# Patient Record
Sex: Male | Born: 1975 | Hispanic: Yes | Smoking: Never smoker
Health system: Southern US, Community
[De-identification: ages and names within clinical notes are randomized; demographics above are authoritative.]

## PROBLEM LIST (undated history)

## (undated) HISTORY — PX: BACK SURGERY: SHX140

---

## 2017-07-02 ENCOUNTER — Encounter (HOSPITAL_COMMUNITY): Payer: Self-pay | Admitting: *Deleted

## 2017-07-02 ENCOUNTER — Inpatient Hospital Stay (HOSPITAL_COMMUNITY)
Admission: EM | Admit: 2017-07-02 | Discharge: 2017-07-04 | DRG: 872 | Disposition: A | Discharge status: Home / Self Care | Payer: Self-pay | Attending: Internal Medicine | Admitting: Internal Medicine

## 2017-07-02 ENCOUNTER — Emergency Department (HOSPITAL_COMMUNITY): Payer: Self-pay

## 2017-07-02 ENCOUNTER — Other Ambulatory Visit: Payer: Self-pay

## 2017-07-02 DIAGNOSIS — B2790 Infectious mononucleosis, unspecified without complication: Secondary | ICD-10-CM | POA: Diagnosis present

## 2017-07-02 DIAGNOSIS — K2970 Gastritis, unspecified, without bleeding: Secondary | ICD-10-CM | POA: Diagnosis present

## 2017-07-02 DIAGNOSIS — R161 Splenomegaly, not elsewhere classified: Secondary | ICD-10-CM | POA: Diagnosis present

## 2017-07-02 DIAGNOSIS — R197 Diarrhea, unspecified: Secondary | ICD-10-CM | POA: Diagnosis present

## 2017-07-02 DIAGNOSIS — R112 Nausea with vomiting, unspecified: Secondary | ICD-10-CM | POA: Diagnosis present

## 2017-07-02 DIAGNOSIS — K529 Noninfective gastroenteritis and colitis, unspecified: Secondary | ICD-10-CM

## 2017-07-02 DIAGNOSIS — D72829 Elevated white blood cell count, unspecified: Secondary | ICD-10-CM

## 2017-07-02 DIAGNOSIS — A419 Sepsis, unspecified organism: Principal | ICD-10-CM | POA: Diagnosis present

## 2017-07-02 DIAGNOSIS — R109 Unspecified abdominal pain: Secondary | ICD-10-CM | POA: Diagnosis present

## 2017-07-02 LAB — CBC WITH DIFFERENTIAL/PLATELET
BASOS PCT: 0 %
Band Neutrophils: 10 %
Basophils Absolute: 0 10*3/uL (ref 0.0–0.1)
Blasts: 0 %
EOS PCT: 22 %
Eosinophils Absolute: 4.7 10*3/uL — ABNORMAL HIGH (ref 0.0–0.7)
HCT: 38.7 % — ABNORMAL LOW (ref 39.0–52.0)
Hemoglobin: 12.8 g/dL — ABNORMAL LOW (ref 13.0–17.0)
LYMPHS ABS: 1.7 10*3/uL (ref 0.7–4.0)
Lymphocytes Relative: 8 %
MCH: 29.1 pg (ref 26.0–34.0)
MCHC: 33.1 g/dL (ref 30.0–36.0)
MCV: 88 fL (ref 78.0–100.0)
MONO ABS: 1.3 10*3/uL — AB (ref 0.1–1.0)
MONOS PCT: 6 %
MYELOCYTES: 0 %
Metamyelocytes Relative: 1 %
NEUTROS ABS: 13.7 10*3/uL — AB (ref 1.7–7.7)
NRBC: 0 /100{WBCs}
Neutrophils Relative %: 53 %
Other: 0 %
PLATELETS: 277 10*3/uL (ref 150–400)
Promyelocytes Relative: 0 %
RBC: 4.4 MIL/uL (ref 4.22–5.81)
RDW: 13.6 % (ref 11.5–15.5)
WBC: 21.4 10*3/uL — ABNORMAL HIGH (ref 4.0–10.5)

## 2017-07-02 LAB — URINALYSIS, ROUTINE W REFLEX MICROSCOPIC
Bacteria, UA: NONE SEEN
Bilirubin Urine: NEGATIVE
GLUCOSE, UA: NEGATIVE mg/dL
KETONES UR: 20 mg/dL — AB
Leukocytes, UA: NEGATIVE
NITRITE: NEGATIVE
PH: 6 (ref 5.0–8.0)
Protein, ur: NEGATIVE mg/dL
Specific Gravity, Urine: 1.01 (ref 1.005–1.030)
Squamous Epithelial / LPF: NONE SEEN

## 2017-07-02 LAB — COMPREHENSIVE METABOLIC PANEL
ALT: 22 U/L (ref 17–63)
ANION GAP: 11 (ref 5–15)
AST: 22 U/L (ref 15–41)
Albumin: 3.1 g/dL — ABNORMAL LOW (ref 3.5–5.0)
Alkaline Phosphatase: 66 U/L (ref 38–126)
BUN: 13 mg/dL (ref 6–20)
CO2: 22 mmol/L (ref 22–32)
Calcium: 8.2 mg/dL — ABNORMAL LOW (ref 8.9–10.3)
Chloride: 99 mmol/L — ABNORMAL LOW (ref 101–111)
Creatinine, Ser: 0.89 mg/dL (ref 0.61–1.24)
Glucose, Bld: 93 mg/dL (ref 65–99)
POTASSIUM: 4 mmol/L (ref 3.5–5.1)
Sodium: 132 mmol/L — ABNORMAL LOW (ref 135–145)
Total Bilirubin: 0.8 mg/dL (ref 0.3–1.2)
Total Protein: 6.8 g/dL (ref 6.5–8.1)

## 2017-07-02 LAB — I-STAT CG4 LACTIC ACID, ED: Lactic Acid, Venous: 0.72 mmol/L (ref 0.5–1.9)

## 2017-07-02 LAB — LIPASE, BLOOD: LIPASE: 31 U/L (ref 11–51)

## 2017-07-02 MED ORDER — ONDANSETRON 4 MG PO TBDP: 8.0000 mg | ORAL_TABLET | Freq: Once | ORAL | Status: DC

## 2017-07-02 MED ORDER — ONDANSETRON HCL 4 MG/2ML IJ SOLN
4.0000 mg | Freq: Once | INTRAMUSCULAR | Status: AC
  Administered 2017-07-02: 4 mg via INTRAVENOUS
  Filled 2017-07-02: qty 2

## 2017-07-02 MED ORDER — IOPAMIDOL (ISOVUE-300) INJECTION 61%
INTRAVENOUS | Status: AC
  Filled 2017-07-02: qty 100

## 2017-07-02 MED ORDER — IOPAMIDOL (ISOVUE-300) INJECTION 61%
100.0000 mL | Freq: Once | INTRAVENOUS | Status: AC | PRN
  Administered 2017-07-02: 100 mL via INTRAVENOUS

## 2017-07-02 NOTE — ED Notes (Signed)
Pt taken to CT.

## 2017-07-02 NOTE — ED Notes (Addendum)
Pts name called for a room no answer 

## 2017-07-02 NOTE — ED Triage Notes (Signed)
Per Interpreter: pt has been having LUQ pain for the past week that worsens after eating with fever. Pt noted to be diaphoretic. Also reports sore throat and burning in hands and feet.

## 2017-07-02 NOTE — ED Provider Notes (Signed)
Patient placed in Quick Look pathway, seen and evaluated   Chief Complaint: abdominal pain  HPI:   Upper abdominal pain in upper abdomen for a week. Initially nausea and vomiting, improved now. Associated diarrhea.  No blood in his stool or emesis.  He has had fevers at home for the last several days.  No urinary symptoms.  ROS: abd pain, nauea, vomiting, diarrhea, fever  Physical Exam:   Gen: Appears to be in pain  Neuro: Awake and Alert  Skin: Warm    Focused Exam: Patient appears to be ill.  He is mildly diaphoretic.  Tenderness to palpation of left upper quadrant and left lower quadrant with some guarding.  Patient with severe left-sided abdominal pain, pain mainly in left upper quadrant and left lower quadrant.  Concerning for colitis in setting of a fever.  Will check labs including lipase to rule out pancreatitis.  Zofran ordered for nausea and vomiting.  Will get CT scan for further evaluation of patient's symptoms.  Does not meet sepsis criteria at this time.  Vitals:   07/02/17 2019 07/02/17 2020  BP:  106/76  Pulse:  98  Resp:  (!) 22  Temp:  100.3 F (37.9 C)  TempSrc:  Oral  SpO2:  100%  Weight: 81.6 kg (180 lb)   Height: 5\' 6"  (1.676 m)       Initiation of care has begun. The patient has been counseled on the process, plan, and necessity for staying for the completion/evaluation, and the remainder of the medical screening examination    Jaynie CrumbleKirichenko, Baeleigh Devincent, Cordelia Poche-C 07/02/17 2044    Melene PlanFloyd, Dan, DO 07/02/17 2307

## 2017-07-03 ENCOUNTER — Observation Stay (HOSPITAL_COMMUNITY): Payer: Self-pay

## 2017-07-03 ENCOUNTER — Other Ambulatory Visit: Payer: Self-pay

## 2017-07-03 ENCOUNTER — Encounter (HOSPITAL_COMMUNITY): Payer: Self-pay | Admitting: Internal Medicine

## 2017-07-03 DIAGNOSIS — R161 Splenomegaly, not elsewhere classified: Secondary | ICD-10-CM | POA: Diagnosis present

## 2017-07-03 DIAGNOSIS — B2790 Infectious mononucleosis, unspecified without complication: Secondary | ICD-10-CM | POA: Diagnosis present

## 2017-07-03 DIAGNOSIS — R112 Nausea with vomiting, unspecified: Secondary | ICD-10-CM

## 2017-07-03 DIAGNOSIS — R109 Unspecified abdominal pain: Secondary | ICD-10-CM | POA: Diagnosis present

## 2017-07-03 DIAGNOSIS — A419 Sepsis, unspecified organism: Secondary | ICD-10-CM | POA: Diagnosis present

## 2017-07-03 DIAGNOSIS — R197 Diarrhea, unspecified: Secondary | ICD-10-CM

## 2017-07-03 DIAGNOSIS — R1012 Left upper quadrant pain: Secondary | ICD-10-CM

## 2017-07-03 LAB — CBC
HEMATOCRIT: 26.9 % — AB (ref 39.0–52.0)
HEMOGLOBIN: 9 g/dL — AB (ref 13.0–17.0)
MCH: 29.9 pg (ref 26.0–34.0)
MCHC: 33.5 g/dL (ref 30.0–36.0)
MCV: 89.4 fL (ref 78.0–100.0)
Platelets: 196 10*3/uL (ref 150–400)
RBC: 3.01 MIL/uL — AB (ref 4.22–5.81)
RDW: 13.8 % (ref 11.5–15.5)
WBC: 15.3 10*3/uL — ABNORMAL HIGH (ref 4.0–10.5)

## 2017-07-03 LAB — I-STAT CG4 LACTIC ACID, ED: LACTIC ACID, VENOUS: 1.07 mmol/L (ref 0.5–1.9)

## 2017-07-03 LAB — PROTIME-INR
INR: 1.23
Prothrombin Time: 15.4 seconds — ABNORMAL HIGH (ref 11.4–15.2)

## 2017-07-03 LAB — BASIC METABOLIC PANEL
ANION GAP: 7 (ref 5–15)
BUN: 10 mg/dL (ref 6–20)
CHLORIDE: 106 mmol/L (ref 101–111)
CO2: 23 mmol/L (ref 22–32)
CREATININE: 0.82 mg/dL (ref 0.61–1.24)
Calcium: 7.1 mg/dL — ABNORMAL LOW (ref 8.9–10.3)
GFR calc non Af Amer: >60 mL/min (ref >60)
Glucose, Bld: 87 mg/dL (ref 65–99)
POTASSIUM: 3.9 mmol/L (ref 3.5–5.1)
Sodium: 136 mmol/L (ref 135–145)

## 2017-07-03 LAB — GROUP A STREP BY PCR: GROUP A STREP BY PCR: NOT DETECTED

## 2017-07-03 LAB — PROCALCITONIN: Procalcitonin: 0.36 ng/mL

## 2017-07-03 LAB — LACTIC ACID, PLASMA: Lactic Acid, Venous: 0.5 mmol/L (ref 0.5–1.9)

## 2017-07-03 LAB — MONONUCLEOSIS SCREEN: MONO SCREEN: POSITIVE — AB

## 2017-07-03 LAB — HIV ANTIBODY (ROUTINE TESTING W REFLEX): HIV SCREEN 4TH GENERATION: NONREACTIVE

## 2017-07-03 MED ORDER — SODIUM CHLORIDE 0.9 % IV SOLN
INTRAVENOUS | Status: DC
Start: 2017-07-03 — End: 2017-07-04
  Administered 2017-07-03 (×2): via INTRAVENOUS

## 2017-07-03 MED ORDER — ACETAMINOPHEN 325 MG PO TABS
650.0000 mg | ORAL_TABLET | Freq: Four times a day (QID) | ORAL | Status: DC | PRN
  Administered 2017-07-03: 650 mg via ORAL
  Filled 2017-07-03: qty 2

## 2017-07-03 MED ORDER — PIPERACILLIN-TAZOBACTAM 3.375 G IVPB
INTRAVENOUS | Status: AC
  Administered 2017-07-03: 04:00:00
  Filled 2017-07-03: qty 50

## 2017-07-03 MED ORDER — FAMOTIDINE IN NACL 20-0.9 MG/50ML-% IV SOLN: 20.0000 mg | Freq: Two times a day (BID) | INTRAVENOUS | Status: DC

## 2017-07-03 MED ORDER — ONDANSETRON HCL 4 MG/2ML IJ SOLN: 4.0000 mg | Freq: Three times a day (TID) | INTRAMUSCULAR | Status: DC | PRN

## 2017-07-03 MED ORDER — PANTOPRAZOLE SODIUM 40 MG IV SOLR
40.0000 mg | Freq: Two times a day (BID) | INTRAVENOUS | Status: DC
  Administered 2017-07-03 (×2): 40 mg via INTRAVENOUS
  Filled 2017-07-03 (×2): qty 40

## 2017-07-03 MED ORDER — TRAMADOL HCL 50 MG PO TABS
50.0000 mg | ORAL_TABLET | Freq: Three times a day (TID) | ORAL | Status: DC | PRN
  Administered 2017-07-03 – 2017-07-04 (×3): 50 mg via ORAL
  Filled 2017-07-03 (×3): qty 1

## 2017-07-03 MED ORDER — MENTHOL 3 MG MT LOZG: 1.0000 | LOZENGE | OROMUCOSAL | Status: DC | PRN

## 2017-07-03 MED ORDER — SODIUM CHLORIDE 0.9 % IV BOLUS
1000.0000 mL | Freq: Once | INTRAVENOUS | Status: AC
  Administered 2017-07-03: 1000 mL via INTRAVENOUS

## 2017-07-03 MED ORDER — ENOXAPARIN SODIUM 40 MG/0.4ML ~~LOC~~ SOLN
40.0000 mg | SUBCUTANEOUS | Status: DC
  Administered 2017-07-03: 40 mg via SUBCUTANEOUS
  Filled 2017-07-03 (×2): qty 0.4

## 2017-07-03 MED ORDER — ZOLPIDEM TARTRATE 5 MG PO TABS: 5.0000 mg | ORAL_TABLET | Freq: Every evening | ORAL | Status: DC | PRN

## 2017-07-03 MED ORDER — ONDANSETRON HCL 4 MG/2ML IJ SOLN
INTRAMUSCULAR | Status: AC
  Administered 2017-07-03: 04:00:00
  Filled 2017-07-03: qty 2

## 2017-07-03 MED ORDER — MORPHINE SULFATE (PF) 4 MG/ML IV SOLN
INTRAVENOUS | Status: AC
  Administered 2017-07-03: 04:00:00
  Filled 2017-07-03: qty 1

## 2017-07-03 MED ORDER — SODIUM CHLORIDE 0.9 % IV BOLUS
1500.0000 mL | Freq: Once | INTRAVENOUS | Status: AC
  Administered 2017-07-03: 1500 mL via INTRAVENOUS

## 2017-07-03 NOTE — ED Notes (Signed)
Ordered pt's Full Liquid Diet tray.

## 2017-07-03 NOTE — ED Notes (Signed)
Admit dr Louretta Parmaagaldo called and [facility]

## 2017-07-03 NOTE — ED Notes (Signed)
ED Provider at bedside. 

## 2017-07-03 NOTE — ED Provider Notes (Signed)
[facility] EMERGENCY DEPARTMENT Provider Note   CSN: [id number] Arrival date & time: 07/02/17  1944     History   Chief Complaint Chief Complaint  Patient presents with  . Abdominal Pain    HPI Jared Copeland is a 42 y.o. male.  Level 5 caveat for language barrier.  Patient presents with left-sided upper abdominal pain that has been coming and going for the past week.  States it waxes and wanes in intensity and is worse when he tries to eat.  He had nausea and vomiting several days ago but none since.  Still has several episodes of loose stools daily.  Denies any fever but has had subjective chills.  No sick contacts.  No recent travel.  He was born in [place] but has been in the [place] for the past 2 years.  Denies any pain with urination or blood in the urine.  No chest pain or shortness of breath.  No previous abdominal surgeries.  The history is provided by the patient. The history is limited by the condition of the patient and a language barrier.  Abdominal Pain   Associated symptoms include nausea and vomiting. Pertinent negatives include fever, dysuria, hematuria, arthralgias and myalgias.    History reviewed. No pertinent past medical history.  There are no active problems to display for this patient.   Past Surgical History:  Procedure Laterality Date  . BACK SURGERY          Home Medications    Prior to Admission medications   Not on File    Family History No family history on file.  Social History Social History   Tobacco Use  . Smoking status: Never Smoker  . Smokeless tobacco: Never Used  Substance Use Topics  . Alcohol use: Never    Frequency: Never  . Drug use: Never     Allergies   Patient has no known allergies.   Review of Systems Review of Systems  Constitutional: Positive for activity change, appetite change, chills and fatigue. Negative for fever.  HENT: Negative for congestion.   Eyes: Negative for  visual disturbance.  Respiratory: Negative for cough, chest tightness and shortness of breath.   Cardiovascular: Negative for chest pain.  Gastrointestinal: Positive for abdominal pain, nausea and vomiting.  Genitourinary: Negative for dysuria, hematuria, testicular pain and urgency.  Musculoskeletal: Negative for arthralgias and myalgias.  Skin: Negative for rash.  Neurological: Negative for dizziness, weakness and light-headedness.    all other systems are negative except as noted in the HPI and PMH.    Physical Exam Updated Vital Signs BP 102/61 (BP Location: Right Arm)   Pulse 82   Temp 99.5 F (37.5 C) (Oral)   Resp 16   Ht 5\' 6"  (1.676 m)   Wt 81.6 kg (180 lb)   SpO2 99%   BMI 29.05 kg/m   Physical Exam  Constitutional: He is oriented to person, place, and time. He appears well-developed and well-nourished. No distress.  Appears ill  HENT:  Head: Normocephalic and atraumatic.  Mouth/Throat: Oropharynx is clear and moist. No oropharyngeal exudate.  Eyes: Pupils are equal, round, and reactive to light. Conjunctivae and EOM are normal.  Neck: Normal range of motion. Neck supple.  No meningismus.  Cardiovascular: Normal rate, regular rhythm, normal heart sounds and intact distal pulses.  No murmur heard. Pulmonary/Chest: Effort normal and breath sounds normal. No respiratory distress.  Abdominal: Soft. There is tenderness. There is guarding. There is no rebound.  TTP LUQ with guarding  Genitourinary:  Genitourinary Comments: No testicular pain  Musculoskeletal: Normal range of motion. He exhibits no edema or tenderness.  Neurological: He is alert and oriented to person, place, and time. No cranial nerve deficit. He exhibits normal muscle tone. Coordination normal.  No ataxia on finger to nose bilaterally. No pronator drift. 5/5 strength throughout. CN 2-12 intact.Equal grip strength. Sensation intact.   Skin: Skin is warm.  Psychiatric: He has a normal mood and affect.  His behavior is normal.  Nursing note and vitals reviewed.    ED Treatments / Results  Labs (all labs ordered are listed, but only abnormal results are displayed) Labs Reviewed  CBC WITH DIFFERENTIAL/PLATELET - Abnormal; Notable for the following components:      Result Value   WBC 21.4 (*)    Hemoglobin 12.8 (*)    HCT 38.7 (*)    Neutro Abs 13.7 (*)    Monocytes Absolute 1.3 (*)    Eosinophils Absolute 4.7 (*)    All other components within normal limits  COMPREHENSIVE METABOLIC PANEL - Abnormal; Notable for the following components:   Sodium 132 (*)    Chloride 99 (*)    Calcium 8.2 (*)    Albumin 3.1 (*)    All other components within normal limits  URINALYSIS, ROUTINE W REFLEX MICROSCOPIC - Abnormal; Notable for the following components:   Hgb urine dipstick SMALL (*)    Ketones, ur 20 (*)    All other components within normal limits  MONONUCLEOSIS SCREEN - Abnormal; Notable for the following components:   Mono Screen POSITIVE (*)    All other components within normal limits  GROUP A STREP BY PCR  GASTROINTESTINAL PANEL BY PCR, STOOL (REPLACES STOOL CULTURE)  C DIFFICILE QUICK SCREEN W PCR REFLEX  RAPID STREP SCREEN ([facility] & [facility] ONLY)  LIPASE, BLOOD  EPSTEIN-BARR VIRUS NUCLEAR ANTIGEN ANTIBODY, IGG  I-STAT CG4 LACTIC ACID, ED  I-STAT CG4 LACTIC ACID, ED  I-STAT CG4 LACTIC ACID, ED    EKG None  Radiology Ct Abdomen Pelvis W Contrast  Result Date: 07/02/2017 CLINICAL DATA:  Left upper quadrant abdominal pain for 1 week, worsened postprandially. EXAM: CT ABDOMEN AND PELVIS WITH CONTRAST TECHNIQUE: Multidetector CT imaging of the abdomen and pelvis was performed using the standard protocol following bolus administration of intravenous contrast. CONTRAST:  ISOVUE-300 IOPAMIDOL (ISOVUE-300) INJECTION 61% COMPARISON:  None. FINDINGS: Lower chest: No significant pulmonary nodules or acute consolidative airspace disease. Hepatobiliary: Normal liver size. No liver  mass. Normal gallbladder with no radiopaque cholelithiasis. No biliary ductal dilatation. Pancreas: Normal, with no mass or duct dilation. Spleen: Mildly enlarged spleen (craniocaudal splenic length 13.7 cm). No splenic mass. Adrenals/Urinary Tract: Normal adrenals. Normal kidneys with no hydronephrosis and no renal mass. Normal bladder. Stomach/Bowel: Normal non-distended stomach. Normal caliber small bowel with no small bowel wall thickening. Normal appendix. Normal large bowel with no diverticulosis, large bowel wall thickening or pericolonic fat stranding. Vascular/Lymphatic: Normal caliber abdominal aorta. Patent portal, splenic, hepatic and renal veins. A few mildly enlarged left mesenteric nodes up to 1.0 cm (series 3/image 31). No additional pathologically enlarged abdominopelvic nodes. Reproductive: Mildly enlarged prostate. Other: No pneumoperitoneum, ascites or focal fluid collection. Musculoskeletal: No aggressive appearing focal osseous lesions. Postsurgical changes from anterior and bilateral posterior spinal fusion at L5-S1. Mild thoracolumbar spondylosis. IMPRESSION: 1. Mildly enlarged spleen. Mild left mesenteric lymphadenopathy. These findings are nonspecific, with a lymphoproliferative condition not excluded. Clinical and laboratory correlation advised. Follow-up CT abdomen/pelvis with  oral and IV contrast suggested in 3 months. This recommendation follows ACR consensus guidelines: White Paper of the ACR Incidental Findings Committee II on Splenic and Nodal Findings. J Am Coll Radiol 2013;10:789-794. 2. Mildly enlarged prostate. Electronically Signed   By: Delbert PhenixJason A Poff M.D.   On: 07/02/2017 22:00    Procedures Procedures (including critical care time)  Medications Ordered in ED Medications  iopamidol (ISOVUE-300) 61 % injection (has no administration in time range)  morphine 4 MG/ML injection (has no administration in time range)  ondansetron (ZOFRAN) 4 MG/2ML injection (has no  administration in time range)  piperacillin-tazobactam (ZOSYN) 3.375 GM/50ML IVPB (has no administration in time range)  ondansetron (ZOFRAN) injection 4 mg (4 mg Intravenous Given 07/02/17 2055)  iopamidol (ISOVUE-300) 61 % injection 100 mL (100 mLs Intravenous Contrast Given 07/02/17 2140)     Initial Impression / Assessment and Plan / ED Course  I have reviewed the triage vital signs and the nursing notes.  Pertinent labs & imaging results that were available during my care of the patient were reviewed by me and considered in my medical decision making (see chart for details).    Patient with 1 week of upper abdominal pain with subjective chills, intermittent diarrhea and nausea and vomiting.  Vitals are stable.  Labs obtained in triage show leukocytosis with no fever.  Imaging obtained in triage shows enlarged spleen with inflammation of the left-sided mesentery.  Patient is febrile with leukocytosis.  Monospot and EBV will be checked.  He is given IV fluids As well as a dose of IV Zofran given his inflammation on his CT scan and fever.  Unclear source of patient's spleen enlargement with left-sided abdominal pain fever. Patient does not have a doctor.  He appears ill but nontoxic. Observation admission discussed with Dr. Clyde LundborgNiu. Final Clinical Impressions(s) / ED Diagnoses   Final diagnoses:  Colitis  Leukocytosis, unspecified type    ED Discharge Orders    None       Joene Gelder, Jeannett SeniorStephen, MD 07/03/17 [phone]

## 2017-07-03 NOTE — ED Notes (Signed)
PAGED ADMITTING PER RN  

## 2017-07-03 NOTE — ED Notes (Signed)
Pt's lunch tray arrived. 

## 2017-07-03 NOTE — ED Notes (Signed)
[facility]

## 2017-07-03 NOTE — ED Notes (Signed)
PT asleep at this time. Full liquid meal ordered for PT.

## 2017-07-03 NOTE — ED Notes (Signed)
Pt given 4MG  IV morphine, 4MG  IV zofran, and 3.3G zosyn during downtime.

## 2017-07-03 NOTE — Plan of Care (Signed)
Plan of care 

## 2017-07-03 NOTE — H&P (Addendum)
History and Physical    Jared HurstJose David Ramirez Copeland WUJ:[id number]:[id number] DOB: 03/22/1976 DOA: 07/02/2017  Referring MD/NP/PA:   PCP: No primary care provider on file.   Patient coming from:  The patient is coming from home.  At baseline, pt is independent for most of ADL.   Chief Complaint: abdominal pain, nausea, vomiting, diarrhea  HPI: Jared HurstJose David Ramirez Copeland is a 42 y.o. male without significant past medical history, who presents with abdominal pain, nausea vomiting diarrhea.  Patient states that his abdominal pain has been going on for more than one week. It is located in the left upper quadrant, constant, 7 out of 10 in severity, nonradiating, dull. It is associated with nausea, vomiting and diarrhea. He also has fever and chills. He vomited many times yesterday, and had 4 watery diarrhea yesterday. He reports sore throat, but no cough, shortness of breath or chest pain. He has runny nose. No symptoms of UTI or unilateral weakness.  ED Course: pt was found to have positive mononucleosis screening test, negative rapid strep PCR, WBC 21.4, lipase 0.72, lipase 31, negative urinalysis, electrolytes renal function okay, temperature 100.3, no tachycardia, has tachypnea, oxygen saturation 99% on room air. Patient is placed on MedSurg bed for observation.  # CT abdomen/pelvis showed mild mildly enlarged spleen, mild left mesenteric lymphadenopathy, and mildly enlarged prostate.  Review of Systems:   General: has fevers, chills, no body weight gain, has poor appetite, has fatigue HEENT: no blurry vision, hearing changes or sore throat Respiratory: no dyspnea, coughing, wheezing CV: no chest pain, no palpitations GI: has nausea, vomiting, abdominal pain, diarrhea, no constipation GU: no dysuria, burning on urination, increased urinary frequency, hematuria  Ext: no leg edema Neuro: no unilateral weakness, numbness, or tingling, no vision change or hearing loss Skin: no rash, no skin tear. MSK: No  muscle spasm, no deformity, no limitation of range of movement in spin Heme: No easy bruising.  Travel history: No recent long distant travel.  Allergy: No Known Allergies  History reviewed. No pertinent past medical history.  Past Surgical History:  Procedure Laterality Date  . BACK SURGERY      Social History:  reports that he has never smoked. He has never used smokeless tobacco. He reports that he does not drink alcohol or use drugs.  Family History: Reviewed with patient. Patient does not know medical history of his parents, and he states that his brothers and sisters are all healthy.  Prior to Admission medications   Not on File    Physical Exam: Vitals:   07/02/17 2242 07/03/17 0430 07/03/17 0500 07/03/17 0530  BP: 102/61 100/61 99/62 107/67  Pulse: 82 73 72 79  Resp: 16 19 18 12   Temp: 99.5 F (37.5 C)     TempSrc: Oral     SpO2: 99% 99% 96% 100%  Weight:      Height:       General: Not in acute distress HEENT:       Eyes: PERRL, EOMI, no scleral icterus.       ENT: No discharge from the ears and nose, has pharynx injection, no tonsillar enlargement.        Neck: No JVD, no bruit, no mass felt. Heme: No neck lymph node enlargement. Cardiac: S1/S2, RRR, No murmurs, No gallops or rubs. Respiratory: No rales, wheezing, rhonchi or rubs. GI: Soft, nondistended, has tenderness in LUQ, no rebound pain, no organomegaly, BS present. GU: No hematuria Ext: No pitting leg edema bilaterally. 2+DP/PT pulse bilaterally.  Musculoskeletal: No joint deformities, No joint redness or warmth, no limitation of ROM in spin. Skin: No rashes.  Neuro: Alert, oriented X3, cranial nerves II-XII grossly intact, moves all extremities normally.  Psych: Patient is not psychotic, no suicidal or hemocidal ideation.  Labs on Admission: I have personally reviewed following labs and imaging studies  CBC: Recent Labs  Lab 07/02/17 2043  WBC 21.4*  NEUTROABS 13.7*  HGB 12.8*  HCT 38.7*    MCV 88.0  PLT 277   Basic Metabolic Panel: Recent Labs  Lab 07/02/17 2043  NA 132*  K 4.0  CL 99*  CO2 22  GLUCOSE 93  BUN 13  CREATININE 0.89  CALCIUM 8.2*   GFR: Estimated Creatinine Clearance: 109.5 mL/min (by C-G formula based on SCr of 0.89 mg/dL). Liver Function Tests: Recent Labs  Lab 07/02/17 2043  AST 22  ALT 22  ALKPHOS 66  BILITOT 0.8  PROT 6.8  ALBUMIN 3.1*   Recent Labs  Lab 07/02/17 2043  LIPASE 31   No results for input(s): AMMONIA in the last 168 hours. Coagulation Profile: No results for input(s): INR, PROTIME in the last 168 hours. Cardiac Enzymes: No results for input(s): CKTOTAL, CKMB, CKMBINDEX, TROPONINI in the last 168 hours. BNP (last 3 results) No results for input(s): PROBNP in the last 8760 hours. HbA1C: No results for input(s): HGBA1C in the last 72 hours. CBG: No results for input(s): GLUCAP in the last 168 hours. Lipid Profile: No results for input(s): CHOL, HDL, LDLCALC, TRIG, CHOLHDL, LDLDIRECT in the last 72 hours. Thyroid Function Tests: No results for input(s): TSH, T4TOTAL, FREET4, T3FREE, THYROIDAB in the last 72 hours. Anemia Panel: No results for input(s): VITAMINB12, FOLATE, FERRITIN, TIBC, IRON, RETICCTPCT in the last 72 hours. Urine analysis:    Component Value Date/Time   COLORURINE YELLOW 07/02/2017 2129   APPEARANCEUR CLEAR 07/02/2017 2129   LABSPEC 1.010 07/02/2017 2129   PHURINE 6.0 07/02/2017 2129   GLUCOSEU NEGATIVE 07/02/2017 2129   HGBUR SMALL (A) 07/02/2017 2129   BILIRUBINUR NEGATIVE 07/02/2017 2129   KETONESUR 20 (A) 07/02/2017 2129   PROTEINUR NEGATIVE 07/02/2017 2129   NITRITE NEGATIVE 07/02/2017 2129   LEUKOCYTESUR NEGATIVE 07/02/2017 2129   Sepsis Labs: @LABRCNTIP (procalcitonin:4,lacticidven:4) ) Recent Results (from the past 240 hour(s))  Group A Strep by PCR     Status: None   Collection Time: 07/03/17  4:20 AM  Result Value Ref Range Status   Group A Strep by PCR NOT DETECTED NOT  DETECTED Final    Comment: Performed at [facility], [address] [place], [place] [address]     Radiological Exams on Admission: Ct Abdomen Pelvis W Contrast  Result Date: 07/02/2017 CLINICAL DATA:  Left upper quadrant abdominal pain for 1 week, worsened postprandially. EXAM: CT ABDOMEN AND PELVIS WITH CONTRAST TECHNIQUE: Multidetector CT imaging of the abdomen and pelvis was performed using the standard protocol following bolus administration of intravenous contrast. CONTRAST:  ISOVUE-300 IOPAMIDOL (ISOVUE-300) INJECTION 61% COMPARISON:  None. FINDINGS: Lower chest: No significant pulmonary nodules or acute consolidative airspace disease. Hepatobiliary: Normal liver size. No liver mass. Normal gallbladder with no radiopaque cholelithiasis. No biliary ductal dilatation. Pancreas: Normal, with no mass or duct dilation. Spleen: Mildly enlarged spleen (craniocaudal splenic length 13.7 cm). No splenic mass. Adrenals/Urinary Tract: Normal adrenals. Normal kidneys with no hydronephrosis and no renal mass. Normal bladder. Stomach/Bowel: Normal non-distended stomach. Normal caliber small bowel with no small bowel wall thickening. Normal appendix. Normal large bowel with no  diverticulosis, large bowel wall thickening or pericolonic fat stranding. Vascular/Lymphatic: Normal caliber abdominal aorta. Patent portal, splenic, hepatic and renal veins. A few mildly enlarged left mesenteric nodes up to 1.0 cm (series 3/image 31). No additional pathologically enlarged abdominopelvic nodes. Reproductive: Mildly enlarged prostate. Other: No pneumoperitoneum, ascites or focal fluid collection. Musculoskeletal: No aggressive appearing focal osseous lesions. Postsurgical changes from anterior and bilateral posterior spinal fusion at L5-S1. Mild thoracolumbar spondylosis. IMPRESSION: 1. Mildly enlarged spleen. Mild left mesenteric lymphadenopathy. These findings are nonspecific, with a lymphoproliferative condition  not excluded. Clinical and laboratory correlation advised. Follow-up CT abdomen/pelvis with oral and IV contrast suggested in 3 months. This recommendation follows ACR consensus guidelines: White Paper of the ACR Incidental Findings Committee II on Splenic and Nodal Findings. J Am Coll Radiol 2013;10:789-794. 2. Mildly enlarged prostate. Electronically Signed   By: Delbert Phenix M.D.   On: 07/02/2017 22:00     EKG: Not done in ED   Assessment/Plan Principal Problem:   Mononucleosis Active Problems:   Sepsis (HCC)   Nausea vomiting and diarrhea   Diarrhea   Abdominal pain   Splenomegaly   Mononucleosis and sepsis: pt meets criteria for sepsis with leukocytosis, fever and tachypnea. Lactic acid is normal. Currently hemodynamically stable. Pt was given one dose of zosyn before knowing positive mono test in ED, will not continue.  -will place on med-surg bed for obs -surportive care -will get Procalcitonin and trend lactic acid levels per sepsis protocol. -IVF: 2.5 L of NS bolus in ED, followed by 100 cc/h  -f/u Bx -contact and droplet precaution -Cepacol prn for sore throat  Nausea vomiting and diarrhea, and abdominal pain: likely 2/2 to mononucleosis. -Supportive care as above -Follow-up GI pathogen panel and c diff pcr were ordered by ED physician -IV pepcid   Splenomegaly: 2/2 to Mononucleosis -Patient needs to be educated to avoid risk of spleen rupture    DVT ppx: SQ Lovenox Code Status: Full code Family Communication: None at bed side.   Disposition Plan:  Anticipate discharge back to previous home environment Consults called:  none Admission status:    medical floor/obs        Date of Service 07/03/2017    Lorretta Harp [facility] Pager [phone]  If 7PM-7AM, please contact night-coverage [email or web address] Password [facility] 07/03/2017, 6:03 AM

## 2017-07-03 NOTE — ED Notes (Signed)
Please see downtime record.

## 2017-07-03 NOTE — Progress Notes (Addendum)
PROGRESS NOTE    Jared Copeland  FAO:[id number] DOB: 08/17/1975 DOA: 07/02/2017 PCP: No primary care provider on file.  Brief Narrative:  Jared Copeland is a 42 y.o. male without significant past medical history, who presents with abdominal pain, nausea vomiting diarrhea.  Patient states that his abdominal pain has been going on for more than one week. It is located in the left upper quadrant, constant, 7 out of 10 in severity, nonradiating, dull. It is associated with nausea, vomiting and diarrhea. He also has fever and chills. He vomited many times yesterday, and had 4 watery diarrhea yesterday. He reports sore throat, but no cough, shortness of breath or chest pain. He has runny nose. No symptoms of UTI or unilateral weakness.  ED Course: pt was found to have positive mononucleosis screening test, negative rapid strep PCR, WBC 21.4, lipase 0.72, lipase 31, negative urinalysis, electrolytes renal function okay, temperature 100.3, no tachycardia, has tachypnea, oxygen saturation 99% on room air. Patient is placed on MedSurg bed for observation.      Assessment & Plan:   Principal Problem:   Mononucleosis Active Problems:   Sepsis (HCC)   Nausea vomiting and diarrhea   Diarrhea   Abdominal pain   Splenomegaly  1-Nausea, vomiting , diarrhea, suspect related to mononucleosis.  Support care.  IV fluids.  IV protonix, to help with gastritis.   2-Fever, sore throat; mononucleosis.  Support care.  Leukocytosis; check chest x ray.   Splenomegaly: 2/2 to Mononucleosis -Patient needs to be educated to avoid risk of spleen rupture      DVT prophylaxis: Lovenox Code Status: full code.  Family Communication: care discussed with patient.  Disposition Plan: remain inpatient until stable.   Consultants:  none  Procedures:none   Antimicrobials:  none   Subjective: He is complaining of abdominal pain, worse when he eats.  Also vomiting.  Diarrhea  improved.   Objective: Vitals:   07/03/17 0900 07/03/17 1000 07/03/17 1045 07/03/17 1057  BP: 109/70  99/63 99/63  Pulse: 88  93 92  Resp: 13   (!) 22  Temp:  (!) 100.5 F (38.1 C)    TempSrc:  Oral    SpO2: 99%  100% 98%  Weight:      Height:        Intake/Output Summary (Last 24 hours) at 07/03/2017 1403 Last data filed at 07/03/2017 1330 Gross per 24 hour  Intake -  Output 1025 ml  Net -1025 ml   Filed Weights   07/02/17 2019  Weight: 81.6 kg (180 lb)    Examination:  General exam: Appears calm and comfortable  Respiratory system: Clear to auscultation. Respiratory effort normal. Cardiovascular system: S1 & S2 heard, RRR. No JVD, murmurs, rubs, gallops or clicks. No pedal edema. Gastrointestinal system: Abdomen is nondistended, soft and nontender. No organomegaly or masses felt. Normal bowel sounds heard. Central nervous system: Alert and oriented. No focal neurological deficits. Extremities: Symmetric 5 x 5 power. Skin: No rashes, lesions or ulcers Psychiatry: Judgement and insight appear normal. Mood & affect appropriate.     Data Reviewed: I have personally reviewed following labs and imaging studies  CBC: Recent Labs  Lab 07/02/17 2043 07/03/17 0626  WBC 21.4* 15.3*  NEUTROABS 13.7*  --   HGB 12.8* 9.0*  HCT 38.7* 26.9*  MCV 88.0 89.4  PLT 277 196   Basic Metabolic Panel: Recent Labs  Lab 07/02/17 2043 07/03/17 0638  NA 132* 136  K 4.0 3.9  CL  99* 106  CO2 22 23  GLUCOSE 93 87  BUN 13 10  CREATININE 0.89 0.82  CALCIUM 8.2* 7.1*   GFR: Estimated Creatinine Clearance: 118.9 mL/min (by C-G formula based on SCr of 0.82 mg/dL). Liver Function Tests: Recent Labs  Lab 07/02/17 2043  AST 22  ALT 22  ALKPHOS 66  BILITOT 0.8  PROT 6.8  ALBUMIN 3.1*   Recent Labs  Lab 07/02/17 2043  LIPASE 31   No results for input(s): AMMONIA in the last 168 hours. Coagulation Profile: Recent Labs  Lab 07/03/17 0624  INR 1.23   Cardiac  Enzymes: No results for input(s): CKTOTAL, CKMB, CKMBINDEX, TROPONINI in the last 168 hours. BNP (last 3 results) No results for input(s): PROBNP in the last 8760 hours. HbA1C: No results for input(s): HGBA1C in the last 72 hours. CBG: No results for input(s): GLUCAP in the last 168 hours. Lipid Profile: No results for input(s): CHOL, HDL, LDLCALC, TRIG, CHOLHDL, LDLDIRECT in the last 72 hours. Thyroid Function Tests: No results for input(s): TSH, T4TOTAL, FREET4, T3FREE, THYROIDAB in the last 72 hours. Anemia Panel: No results for input(s): VITAMINB12, FOLATE, FERRITIN, TIBC, IRON, RETICCTPCT in the last 72 hours. Sepsis Labs: Recent Labs  Lab 07/02/17 2101 07/03/17 0525 07/03/17 0624  PROCALCITON  --   --  0.36  LATICACIDVEN 0.72 1.07 0.5    Recent Results (from the past 240 hour(s))  Group A Strep by PCR     Status: None   Collection Time: 07/03/17  4:20 AM  Result Value Ref Range Status   Group A Strep by PCR NOT DETECTED NOT DETECTED Final    Comment: Performed at [facility], [address] [place], [place] [address]         Radiology Studies: Ct Abdomen Pelvis W Contrast  Result Date: 07/02/2017 CLINICAL DATA:  Left upper quadrant abdominal pain for 1 week, worsened postprandially. EXAM: CT ABDOMEN AND PELVIS WITH CONTRAST TECHNIQUE: Multidetector CT imaging of the abdomen and pelvis was performed using the standard protocol following bolus administration of intravenous contrast. CONTRAST:  100mL ISOVUE-300 IOPAMIDOL (ISOVUE-300) INJECTION 61% COMPARISON:  None. FINDINGS: Lower chest: No significant pulmonary nodules or acute consolidative airspace disease. Hepatobiliary: Normal liver size. No liver mass. Normal gallbladder with no radiopaque cholelithiasis. No biliary ductal dilatation. Pancreas: Normal, with no mass or duct dilation. Spleen: Mildly enlarged spleen (craniocaudal splenic length 13.7 cm). No splenic mass. Adrenals/Urinary Tract: Normal adrenals.  Normal kidneys with no hydronephrosis and no renal mass. Normal bladder. Stomach/Bowel: Normal non-distended stomach. Normal caliber small bowel with no small bowel wall thickening. Normal appendix. Normal large bowel with no diverticulosis, large bowel wall thickening or pericolonic fat stranding. Vascular/Lymphatic: Normal caliber abdominal aorta. Patent portal, splenic, hepatic and renal veins. A few mildly enlarged left mesenteric nodes up to 1.0 cm (series 3/image 31). No additional pathologically enlarged abdominopelvic nodes. Reproductive: Mildly enlarged prostate. Other: No pneumoperitoneum, ascites or focal fluid collection. Musculoskeletal: No aggressive appearing focal osseous lesions. Postsurgical changes from anterior and bilateral posterior spinal fusion at L5-S1. Mild thoracolumbar spondylosis. IMPRESSION: 1. Mildly enlarged spleen. Mild left mesenteric lymphadenopathy. These findings are nonspecific, with a lymphoproliferative condition not excluded. Clinical and laboratory correlation advised. Follow-up CT abdomen/pelvis with oral and IV contrast suggested in 3 months. This recommendation follows ACR consensus guidelines: White Paper of the ACR Incidental Findings Committee II on Splenic and Nodal Findings. J Am Coll Radiol 2013;10:789-794. 2. Mildly enlarged prostate. Electronically Signed   By: Delbert PhenixJason A Poff  M.D.   On: 07/02/2017 22:00        Scheduled Meds: . enoxaparin (LOVENOX) injection  40 mg Subcutaneous Q24H  . pantoprazole (PROTONIX) IV  40 mg Intravenous Q12H   Continuous Infusions: . sodium chloride 100 mL/hr at 07/03/17 0531     LOS: 0 days    Time spent: 35 minutes.     Alba Cory, MD [facility] Pager [phone]  If 7PM-7AM, please contact night-coverage [email or web address] Password [email or web address] 07/03/2017, 2:03 PM

## 2017-07-04 DIAGNOSIS — B2790 Infectious mononucleosis, unspecified without complication: Secondary | ICD-10-CM

## 2017-07-04 LAB — BASIC METABOLIC PANEL
Anion gap: 7 (ref 5–15)
BUN: 7 mg/dL (ref 6–20)
CALCIUM: 8.1 mg/dL — AB (ref 8.9–10.3)
CO2: 27 mmol/L (ref 22–32)
CREATININE: 0.85 mg/dL (ref 0.61–1.24)
Chloride: 102 mmol/L (ref 101–111)
Glucose, Bld: 84 mg/dL (ref 65–99)
Potassium: 4.1 mmol/L (ref 3.5–5.1)
SODIUM: 136 mmol/L (ref 135–145)

## 2017-07-04 LAB — CBC
HCT: 35.3 % — ABNORMAL LOW (ref 39.0–52.0)
HEMOGLOBIN: 11.5 g/dL — AB (ref 13.0–17.0)
MCH: 29.1 pg (ref 26.0–34.0)
MCHC: 32.6 g/dL (ref 30.0–36.0)
MCV: 89.4 fL (ref 78.0–100.0)
PLATELETS: 257 10*3/uL (ref 150–400)
RBC: 3.95 MIL/uL — AB (ref 4.22–5.81)
RDW: 13.5 % (ref 11.5–15.5)
WBC: 14.5 10*3/uL — AB (ref 4.0–10.5)

## 2017-07-04 LAB — EPSTEIN-BARR VIRUS NUCLEAR ANTIGEN ANTIBODY, IGG: EBV NA IgG: >600 U/mL — ABNORMAL HIGH (ref 0.0–17.9)

## 2017-07-04 MED ORDER — PANTOPRAZOLE SODIUM 40 MG PO TBEC: 40.0000 mg | DELAYED_RELEASE_TABLET | Freq: Two times a day (BID) | ORAL | Status: DC

## 2017-07-04 MED ORDER — MENTHOL 3 MG MT LOZG: 1.0000 | LOZENGE | OROMUCOSAL | 0 refills | Status: DC | PRN

## 2017-07-04 NOTE — Progress Notes (Signed)
Went over discharge instructions with patient using the interpretor. IV was removed. Patient aware of the need to follow up and gave prescription. Work note was given to patient.

## 2017-07-04 NOTE — Discharge Instructions (Signed)
Mononucleosis infecciosa (Infectious Mononucleosis) La mononucleosis infecciosa es una infeccin causada por un virus. Se la conoce comnmente como mono. Puede contraerla por contacto directo con una persona infectada. Si tiene mono, puede sentirse cansado y Surveyor, miningtener dolor de garganta, dolor de Turkmenistancabeza o fiebre. CUIDADOS EN EL HOGAR  Descanse todo lo que sea necesario.  No practique deportes de contacto, no realice ejercicio intenso ni levante nada pesado hasta que el mdico le indique que puede Shaktoolikhacerlo. Quizs deba esperar unos meses antes de hacer deporte. El hgado y el bazo podran estar hinchados y lesionarse.  Beba suficiente lquido para mantener el pis (orina) claro o de color amarillo plido.  No beba alcohol.  Tome los medicamentos solamente como se lo haya indicado el mdico. No les d aspirinas a los nios.  Coma alimentos blandos. Alimentos fros como helado o paletas heladas pueden aliviar el dolor de garganta.  Si tiene dolor de Advertising copywritergarganta, hgase grgaras con una mezcla de sal y Westley Hummeragua. Mezcle una cucharadita de sal con una taza de agua tibia.  Chupar caramelos duros Landpuede aliviar el dolor de garganta.  Evite besar o compartir su vaso hasta que el mdico le diga que est mejor.  SOLICITE AYUDA SI:  La fiebre no desaparece despus de 10 das.  Los ganglios hinchados no vuelven a la normalidad despus de Field seismologistcuatro semanas.  Su nivel de actividad no vuelve a la normalidad despus de American Financialdos meses.  Tiene un color amarillento en los ojos y la piel (ictericia).  Tiene dificultades para defecar (estreimiento).  SOLICITE AYUDA DE INMEDIATO SI:  Tiene un dolor intenso en el abdomen o el hombro.  Babea.  Presenta dificultad para tragar.  Tiene dificultad para respirar.  Presenta rigidez en el cuello.  Tiene dolor de Interior and spatial designercabeza fuerte.  No deja de vomitar.  Tiene espasmos o temblores (convulsiones).  Se siente confundido.  Tiene problemas de equilibrio.  Tiene signos de  prdida de lquidos (deshidratacin): ? Debilidad. ? Ojos hundidos. ? Piel plida. ? Sequedad en la boca. ? La respiracin o la frecuencia cardaca son rpidas.  Esta informacin no tiene Theme park managercomo fin reemplazar el consejo del mdico. Asegrese de hacerle al mdico cualquier pregunta que tenga. Document Released: 04/13/2010 Document Revised: 04/01/2014 Document Reviewed: 11/16/2013 Elsevier Interactive Patient Education  2017 [employer]

## 2017-07-04 NOTE — Discharge Summary (Signed)
Physician Discharge Summary  Jared Copeland ZOX:[id number] DOB: 06-06-1975 DOA: 07/02/2017  PCP: No primary care provider on file.  Admit date: 07/02/2017 Discharge date: 07/04/2017  Admitted From: Home Disposition:  Home  Recommendations for Outpatient Follow-up:  1. Follow up with PCP in 1 week, asked patient to establish with [facility] for follow up  2. Please obtain CBC in 1 week to recheck leukocytosis  3. Please follow up on the following pending results: Final blood culture result, negative to date on day of discharge  4. Will need CT abd/pelvis with oral and IV contrast in 3 months to recheck enlarged spleen and lymphadenopathy  Discharge Condition: Stable CODE STATUS: Full  Diet recommendation: Full liquid diet, advance to soft diet and regular diet as tolerated   Brief/Interim Summary: Jared Copeland a 41 y.o.malewithout significant pastmedical history, who presents with abdominal pain, nausea vomiting diarrhea. Patient states that his abdominal pain has been going on for more than one week. It is located in the left upper quadrant, constant, 7 out of 10 in severity, nonradiating, dull. It is associated with nausea, vomiting and diarrhea. He also has fever and chills. He vomited many times yesterday, and had 4 watery diarrhea yesterday. He reports sore throat, but no cough, shortness of breath or chest pain. He has runny nose. No symptoms of UTI or unilateral weakness. Patient was found to have positive mononucleosis screening test, negative rapid strep PCR. Diarrhea resolved. Patient was managed with conservative therapy and leukocytosis improved. Using the interpreter, he was asked to follow up with PCP and to follow up with repeat CT scan for his splenomegaly and lymphadenopathy. On morning of discharge, he was feeling better and able to tolerate full liquid diet.   Discharge Diagnoses:  Principal Problem:   Mononucleosis Active  Problems:   Sepsis (HCC)   Nausea vomiting and diarrhea   Diarrhea   Abdominal pain   Splenomegaly   Discharge Instructions  Discharge Instructions    Call MD for:  difficulty breathing, headache or visual disturbances   Complete by:  As directed    Call MD for:  extreme fatigue   Complete by:  As directed    Call MD for:  hives   Complete by:  As directed    Call MD for:  persistant dizziness or light-headedness   Complete by:  As directed    Call MD for:  persistant nausea and vomiting   Complete by:  As directed    Call MD for:  severe uncontrolled pain   Complete by:  As directed    Call MD for:  temperature >100.4   Complete by:  As directed    Discharge instructions   Complete by:  As directed    You were cared for by a hospitalist during your hospital stay. If you have any questions about your discharge medications or the care you received while you were in the hospital after you are discharged, you can call the unit and asked to speak with the hospitalist on call if the hospitalist that took care of you is not available. Once you are discharged, your primary care physician will handle any further medical issues. Please note that NO REFILLS for any discharge medications will be authorized once you are discharged, as it is imperative that you return to your primary care physician (or establish a relationship with a primary care physician if you do not have one) for your aftercare needs so that  they can reassess your need for medications and monitor your lab values.   Increase activity slowly   Complete by:  As directed      Allergies as of 07/04/2017   No Known Allergies     Medication List    TAKE these medications   ibuprofen 200 MG tablet Commonly known as:  ADVIL,MOTRIN Take 200-600 mg by mouth every 6 (six) hours as needed for mild pain.   menthol-cetylpyridinium 3 MG lozenge Commonly known as:  CEPACOL Take 1 lozenge (3 mg total) by mouth as needed for sore  throat.      Follow-up Information    [facility]. Schedule an appointment as soon as possible for a visit in 1 week(s).   Contact information: [address] [place] [address] [phone]         No Known Allergies  Consultations:  None   Procedures/Studies: Ct Abdomen Pelvis W Contrast  Result Date: 07/02/2017 CLINICAL DATA:  Left upper quadrant abdominal pain for 1 week, worsened postprandially. EXAM: CT ABDOMEN AND PELVIS WITH CONTRAST TECHNIQUE: Multidetector CT imaging of the abdomen and pelvis was performed using the standard protocol following bolus administration of intravenous contrast. CONTRAST:  ISOVUE-300 IOPAMIDOL (ISOVUE-300) INJECTION 61% COMPARISON:  None. FINDINGS: Lower chest: No significant pulmonary nodules or acute consolidative airspace disease. Hepatobiliary: Normal liver size. No liver mass. Normal gallbladder with no radiopaque cholelithiasis. No biliary ductal dilatation. Pancreas: Normal, with no mass or duct dilation. Spleen: Mildly enlarged spleen (craniocaudal splenic length 13.7 cm). No splenic mass. Adrenals/Urinary Tract: Normal adrenals. Normal kidneys with no hydronephrosis and no renal mass. Normal bladder. Stomach/Bowel: Normal non-distended stomach. Normal caliber small bowel with no small bowel wall thickening. Normal appendix. Normal large bowel with no diverticulosis, large bowel wall thickening or pericolonic fat stranding. Vascular/Lymphatic: Normal caliber abdominal aorta. Patent portal, splenic, hepatic and renal veins. A few mildly enlarged left mesenteric nodes up to 1.0 cm (series 3/image 31). No additional pathologically enlarged abdominopelvic nodes. Reproductive: Mildly enlarged prostate. Other: No pneumoperitoneum, ascites or focal fluid collection. Musculoskeletal: No aggressive appearing focal osseous lesions. Postsurgical changes from anterior and bilateral posterior spinal  fusion at L5-S1. Mild thoracolumbar spondylosis. IMPRESSION: 1. Mildly enlarged spleen. Mild left mesenteric lymphadenopathy. These findings are nonspecific, with a lymphoproliferative condition not excluded. Clinical and laboratory correlation advised. Follow-up CT abdomen/pelvis with oral and IV contrast suggested in 3 months. This recommendation follows ACR consensus guidelines: White Paper of the ACR Incidental Findings Committee II on Splenic and Nodal Findings. J Am Coll Radiol 2013;10:789-794. 2. Mildly enlarged prostate. Electronically Signed   By: Delbert Phenix M.D.   On: 07/02/2017 22:00   Dg Chest Port 1 View  Result Date: 07/03/2017 CLINICAL DATA:  Leukocytosis EXAM: PORTABLE CHEST 1 VIEW COMPARISON:  CT 07/02/2017 FINDINGS: No focal airspace disease. No pleural effusion. Heart size upper limits of normal. Mild interstitial opacity at the bases. No pneumothorax. IMPRESSION: 1. No focal pulmonary infiltrate is seen 2. Minimal increased interstitial opacity at the bases, could be chronic or due to mild interstitial inflammatory changes. Electronically Signed   By: Jasmine Pang M.D.   On: 07/03/2017 14:59       Discharge Exam: Vitals:   07/03/17 2114 07/04/17 0458  BP: 108/72 102/60  Pulse: 83 75  Resp: 19 18  Temp: 98.2 F (36.8 C) 98.5 F (36.9 C)  SpO2: 97% 97%    General: Pt is alert, awake, not in acute distress ENT: +  bilateral enlarged lymphadenopathy  Cardiovascular: RRR, S1/S2 +, no rubs, no gallops Respiratory: CTA bilaterally, no wheezing, no rhonchi Abdominal: Soft, NT, ND, bowel sounds + Extremities: no edema, no cyanosis    The results of significant diagnostics from this hospitalization (including imaging, microbiology, ancillary and laboratory) are listed below for reference.     Microbiology: Recent Results (from the past 240 hour(s))  Group A Strep by PCR     Status: None   Collection Time: 07/03/17  4:20 AM  Result Value Ref Range Status   Group A  Strep by PCR NOT DETECTED NOT DETECTED Final    Comment: Performed at [facility], [address] [place], [place] [address]  Culture, blood (x 2)     Status: None (Preliminary result)   Collection Time: 07/03/17  6:30 AM  Result Value Ref Range Status   Specimen Description BLOOD LEFT HAND  Final   Special Requests   Final    BOTTLES DRAWN AEROBIC AND ANAEROBIC Blood Culture adequate volume   Culture   Final    NO GROWTH 1 DAY Performed at [facility], [address] [place], [place] [address]    Report Status PENDING  Incomplete  Culture, blood (x 2)     Status: None (Preliminary result)   Collection Time: 07/03/17  7:10 AM  Result Value Ref Range Status   Specimen Description BLOOD RIGHT ANTECUBITAL  Final   Special Requests   Final    BOTTLES DRAWN AEROBIC AND ANAEROBIC Blood Culture results may not be optimal due to an excessive volume of blood received in culture bottles   Culture   Final    NO GROWTH 1 DAY Performed at [facility], [address] [place], [place] [address]    Report Status PENDING  Incomplete     Labs: BNP (last 3 results) No results for input(s): BNP in the last 8760 hours. Basic Metabolic Panel: Recent Labs  Lab 07/02/17 2043 07/03/17 0638 07/04/17 0558  NA 132* 136 136  K 4.0 3.9 4.1  CL 99* 106 102  CO2 22 23 27   GLUCOSE 93 87 84  BUN 13 10 7   CREATININE 0.89 0.82 0.85  CALCIUM 8.2* 7.1* 8.1*   Liver Function Tests: Recent Labs  Lab 07/02/17 2043  AST 22  ALT 22  ALKPHOS 66  BILITOT 0.8  PROT 6.8  ALBUMIN 3.1*   Recent Labs  Lab 07/02/17 2043  LIPASE 31   No results for input(s): AMMONIA in the last 168 hours. CBC: Recent Labs  Lab 07/02/17 2043 07/03/17 0626 07/04/17 0558  WBC 21.4* 15.3* 14.5*  NEUTROABS 13.7*  --   --   HGB 12.8* 9.0* 11.5*  HCT 38.7* 26.9* 35.3*  MCV 88.0 89.4 89.4  PLT 277 196 257   Cardiac Enzymes: No results for input(s): CKTOTAL, CKMB, CKMBINDEX, TROPONINI in the  last 168 hours. BNP: Invalid input(s): POCBNP CBG: No results for input(s): GLUCAP in the last 168 hours. D-Dimer No results for input(s): DDIMER in the last 72 hours. Hgb A1c No results for input(s): HGBA1C in the last 72 hours. Lipid Profile No results for input(s): CHOL, HDL, LDLCALC, TRIG, CHOLHDL, LDLDIRECT in the last 72 hours. Thyroid function studies No results for input(s): TSH, T4TOTAL, T3FREE, THYROIDAB in the last 72 hours.  Invalid input(s): FREET3 Anemia work up No results for input(s): VITAMINB12, FOLATE, FERRITIN, TIBC, IRON, RETICCTPCT in the last 72 hours. Urinalysis    Component Value Date/Time  COLORURINE YELLOW 07/02/2017 2129   APPEARANCEUR CLEAR 07/02/2017 2129   LABSPEC 1.010 07/02/2017 2129   PHURINE 6.0 07/02/2017 2129   GLUCOSEU NEGATIVE 07/02/2017 2129   HGBUR SMALL (A) 07/02/2017 2129   BILIRUBINUR NEGATIVE 07/02/2017 2129   KETONESUR 20 (A) 07/02/2017 2129   PROTEINUR NEGATIVE 07/02/2017 2129   NITRITE NEGATIVE 07/02/2017 2129   LEUKOCYTESUR NEGATIVE 07/02/2017 2129   Sepsis Labs Invalid input(s): PROCALCITONIN,  WBC,  LACTICIDVEN Microbiology Recent Results (from the past 240 hour(s))  Group A Strep by PCR     Status: None   Collection Time: 07/03/17  4:20 AM  Result Value Ref Range Status   Group A Strep by PCR NOT DETECTED NOT DETECTED Final    Comment: Performed at [facility], [address], [place], [place] [address]  Culture, blood (x 2)     Status: None (Preliminary result)   Collection Time: 07/03/17  6:30 AM  Result Value Ref Range Status   Specimen Description BLOOD LEFT HAND  Final   Special Requests   Final    BOTTLES DRAWN AEROBIC AND ANAEROBIC Blood Culture adequate volume   Culture   Final    NO GROWTH 1 DAY Performed at [facility], [address], [place], [place] [address]    Report Status PENDING  Incomplete  Culture, blood (x 2)     Status: None (Preliminary result)   Collection Time: 07/03/17   7:10 AM  Result Value Ref Range Status   Specimen Description BLOOD RIGHT ANTECUBITAL  Final   Special Requests   Final    BOTTLES DRAWN AEROBIC AND ANAEROBIC Blood Culture results may not be optimal due to an excessive volume of blood received in culture bottles   Culture   Final    NO GROWTH 1 DAY Performed at [facility], [address] [place], [place] [address]    Report Status PENDING  Incomplete     Patient was seen and examined on the day of discharge and was found to be in stable condition. Time coordinating discharge: 25 minutes including assessment and coordination of care, as well as examination of the patient.   SIGNED:  Noralee Stain, DO [facility] Pager [phone]  If 7PM-7AM, please contact night-coverage [email or web address] Password [facility] 07/04/2017, 10:56 AM

## 2017-07-04 NOTE — Progress Notes (Signed)
CM consult for medication assistance. Pt does not appear to be discharging on anything other than over the counter meds per his AVS. Pt also instructed to [facility] when discharged. Sandford Crazeora Kelsei Defino Rn,BSN,NCM [phone]

## 2017-07-08 LAB — CULTURE, BLOOD (ROUTINE X 2)
CULTURE: NO GROWTH
Culture: NO GROWTH
Special Requests: ADEQUATE

## 2017-07-17 ENCOUNTER — Ambulatory Visit: Payer: Self-pay | Attending: Family Medicine | Admitting: Physician Assistant

## 2017-07-17 ENCOUNTER — Other Ambulatory Visit: Payer: Self-pay

## 2017-07-17 VITALS — BP 108/70 | HR 92 | Temp 98.4°F | Resp 18 | Ht 66.0 in | Wt 164.8 lb

## 2017-07-17 DIAGNOSIS — R161 Splenomegaly, not elsewhere classified: Secondary | ICD-10-CM

## 2017-07-17 DIAGNOSIS — B2790 Infectious mononucleosis, unspecified without complication: Secondary | ICD-10-CM

## 2017-07-17 DIAGNOSIS — Z791 Long term (current) use of non-steroidal anti-inflammatories (NSAID): Secondary | ICD-10-CM | POA: Insufficient documentation

## 2017-07-17 DIAGNOSIS — N400 Enlarged prostate without lower urinary tract symptoms: Secondary | ICD-10-CM | POA: Insufficient documentation

## 2017-07-17 DIAGNOSIS — R197 Diarrhea, unspecified: Secondary | ICD-10-CM | POA: Insufficient documentation

## 2017-07-17 DIAGNOSIS — Z79899 Other long term (current) drug therapy: Secondary | ICD-10-CM | POA: Insufficient documentation

## 2017-07-17 DIAGNOSIS — Z09 Encounter for follow-up examination after completed treatment for conditions other than malignant neoplasm: Secondary | ICD-10-CM | POA: Insufficient documentation

## 2017-07-17 DIAGNOSIS — Z9889 Other specified postprocedural states: Secondary | ICD-10-CM | POA: Insufficient documentation

## 2017-07-17 NOTE — Patient Instructions (Signed)
You can pick up Immodium over the counter for the diarrhea

## 2017-07-17 NOTE — Progress Notes (Signed)
Hospital f/up stomach pain that comes & goes

## 2017-07-17 NOTE — Progress Notes (Signed)
Obe Ahlers  WJX:[id number]  [id number]:[id number]  DOB - 1975-08-28  Chief Complaint  Patient presents with  . Hospitalization Follow-up       Subjective:   Jared Copeland is a 42 y.o. male here today for establishment of care. He has no significant past medical history.He presented to the ED on 07/02/17 with LUQ abdominal pain, N, V and diarrhea. He also had some fevers and chills. In the ED he tested positive for mono, neg for strep. WBC 21K. Lipase 31. U/A negative. CT A&P showed mildly enlarged spleen with mild left mesenteric LAD and a mildly enlarged prostate.   Admitted by the Internal Medicine team and treated conservatively. Uncomplicated course. Blood cultures negative.  Today with occasional fever still. Some LUQ pain intermittently especially with eating. Appetite ok.    ROS: GEN: + fever no chills, denies change in weight Skin: denies lesions or rashes HEENT: denies headache, earache, epistaxis, sore throat, or neck pain ABD: + abd pain, no N or V +diarrhea EXT: denies muscle spasms or swelling; no pain in lower ext, no weakness NEURO: denies numbness or tingling, denies sz, stroke or TIA  ALLERGIES: No Known Allergies  PAST MEDICAL HISTORY: No past medical history on file.  PAST SURGICAL HISTORY: Past Surgical History:  Procedure Laterality Date  . BACK SURGERY      MEDICATIONS AT HOME: Prior to Admission medications   Medication Sig Start Date End Date Taking? Authorizing Provider  ibuprofen (ADVIL,MOTRIN) 200 MG tablet Take 200-600 mg by mouth every 6 (six) hours as needed for mild pain.   Yes [provider]  menthol-cetylpyridinium (CEPACOL) 3 MG lozenge Take 1 lozenge (3 mg total) by mouth as needed for sore throat. Patient not taking: Reported on 07/17/2017 07/04/17   Noralee Stain, DO   Family-noncontributory Social-married, has kids, works at [employer]  Objective:   Vitals:   07/17/17 1346  BP: 108/70  Pulse: 92  Resp: 18    Temp: 98.4 F (36.9 C)  TempSrc: Oral  SpO2: 95%  Weight: 164 lb 12.8 oz (74.8 kg)  Height: 5\' 6"  (1.676 m)    Exam General appearance : Awake, alert, not in any distress. Speech Clear. Not toxic looking HEENT: Atraumatic and Normocephalic, pupils equally reactive to light and accomodation Neck: supple, no JVD. No cervical lymphadenopathy.  Chest:Good air entry bilaterally, no added sounds  CVS: S1 S2 regular, no murmurs.  Abdomen: Bowel sounds present, some tenderness to palp LUQ and not distended with no guarding, rigidity or rebound. Extremities: B/L Lower Ext shows no edema, both legs are warm to touch Neurology: Awake alert, and oriented X 3, CN II-XII intact, Non focal   Assessment & Plan  1. Mononucleosis  -CBC today  -cont conservative measures  -Immodium 2. Spenomegaly  -avoid sports or injury to spleen   -recheck CT scan A&P in 3 months   Return in about 1 month (around 08/14/2017).  The patient was given clear instructions to go to ER or return to medical center if symptoms don't improve, worsen or new problems develop. The patient verbalized understanding. The patient was told to call to get lab results if they haven't heard anything in the next week.   Total time spent with patient was 33 min). Greater than 50 % of this visit was spent face to face counseling and coordinating care regarding risk factor modification, compliance importance and encouragement, education related to hospitalization review.  This note has been created with Dragon speech recognition  Scientist, product/process developmentsoftware and smart phrase technology. Any transcriptional errors are unintentional.    Scot Juniffany Donisha Hoch, PA-C [facility] [place], [place] [phone]   07/17/2017, 2:44 PM

## 2017-07-18 LAB — CBC WITH DIFFERENTIAL/PLATELET
BASOS ABS: 0.1 10*3/uL (ref 0.0–0.2)
BASOS: 1 %
EOS (ABSOLUTE): 3.8 10*3/uL — ABNORMAL HIGH (ref 0.0–0.4)
EOS: 32 %
HEMOGLOBIN: 12.4 g/dL — AB (ref 13.0–17.7)
Hematocrit: 38 % (ref 37.5–51.0)
IMMATURE GRANS (ABS): 0 10*3/uL (ref 0.0–0.1)
Immature Granulocytes: 0 %
LYMPHS ABS: 1.9 10*3/uL (ref 0.7–3.1)
Lymphs: 16 %
MCH: 29.1 pg (ref 26.6–33.0)
MCHC: 32.6 g/dL (ref 31.5–35.7)
MCV: 89 fL (ref 79–97)
MONOCYTES: 6 %
Monocytes Absolute: 0.7 10*3/uL (ref 0.1–0.9)
NEUTROS ABS: 5.5 10*3/uL (ref 1.4–7.0)
Neutrophils: 45 %
PLATELETS: 339 10*3/uL (ref 150–379)
RBC: 4.26 x10E6/uL (ref 4.14–5.80)
RDW: 14.1 % (ref 12.3–15.4)
WBC: 12 10*3/uL — ABNORMAL HIGH (ref 3.4–10.8)

## 2017-08-19 ENCOUNTER — Ambulatory Visit: Payer: Self-pay | Attending: Internal Medicine | Admitting: Internal Medicine

## 2017-08-19 ENCOUNTER — Encounter: Payer: Self-pay | Admitting: Internal Medicine

## 2017-08-19 VITALS — BP 107/69 | HR 75 | Temp 98.5°F | Resp 16 | Ht 66.0 in | Wt 167.0 lb

## 2017-08-19 DIAGNOSIS — Z791 Long term (current) use of non-steroidal anti-inflammatories (NSAID): Secondary | ICD-10-CM | POA: Insufficient documentation

## 2017-08-19 DIAGNOSIS — R161 Splenomegaly, not elsewhere classified: Secondary | ICD-10-CM

## 2017-08-19 DIAGNOSIS — Z888 Allergy status to other drugs, medicaments and biological substances status: Secondary | ICD-10-CM | POA: Insufficient documentation

## 2017-08-19 DIAGNOSIS — R1013 Epigastric pain: Secondary | ICD-10-CM

## 2017-08-19 DIAGNOSIS — Z9889 Other specified postprocedural states: Secondary | ICD-10-CM | POA: Insufficient documentation

## 2017-08-19 MED ORDER — OMEPRAZOLE 20 MG PO CPDR: 20.0000 mg | DELAYED_RELEASE_CAPSULE | Freq: Every day | ORAL | 3 refills | Status: DC

## 2017-08-19 MED FILL — OMEPRAZOLE 20 MG CAP: 20 | 30 days supply | Qty: 30 | Fill #0

## 2017-08-19 NOTE — Progress Notes (Signed)
Patient ID: Jared Copeland, male    DOB: 1975-05-15  MRN: [id number]  CC: Establish Care and Abdominal Pain   Subjective: Jared Copeland is a 42 y.o. male who presents for f/u and to establish with me as PCP His concerns today include:   Pt was hosp last mth with what acute gastroenteritis and mononucleosis.  He had leukocytosis and found to have splenomegaly and few mildly enlarged left mesenteric nodes on CT scan.  Repeat CT scan of abd/pelvis in 3 mths suggested. Today he reports no further n/v/d.  Upper abdominal pain intermittently when he eats certain foods like bread, beef and pork.  feels likehis stomach gets inflamed.  No bloating or burning.  -no fever, night sweats occasionally.  No chronic cough.  No blood in stools.  No rash.  Wgh recorded as 180 lbs on hosp admission, 164 on op-visit 1 mth ago and 167 lbs today.  Pt thinks he has loss some wgh as clothes fitting a little lose.   Patient Active Problem List   Diagnosis Date Noted  . Sepsis (HCC) 07/03/2017  . Nausea vomiting and diarrhea 07/03/2017  . Diarrhea 07/03/2017  . Abdominal pain 07/03/2017  . Splenomegaly 07/03/2017  . Mononucleosis 07/03/2017     Current Outpatient Medications on File Prior to Visit  Medication Sig Dispense Refill  . ibuprofen (ADVIL,MOTRIN) 200 MG tablet Take 200-600 mg by mouth every 6 (six) hours as needed for mild pain.    Marland Kitchen menthol-cetylpyridinium (CEPACOL) 3 MG lozenge Take 1 lozenge (3 mg total) by mouth as needed for sore throat. (Patient not taking: Reported on 07/17/2017) 18 tablet 0   No current facility-administered medications on file prior to visit.     No Known Allergies  Social History   Socioeconomic History  . Marital status: Married    Spouse name: Not on file  . Number of children: Not on file  . Years of education: Not on file  . Highest education level: Not on file  Occupational History  . Not on file  Social Needs  . Financial resource strain:  Not on file  . Food insecurity:    Worry: Not on file    Inability: Not on file  . Transportation needs:    Medical: Not on file    Non-medical: Not on file  Tobacco Use  . Smoking status: Never Smoker  . Smokeless tobacco: Never Used  Substance and Sexual Activity  . Alcohol use: Never    Frequency: Never  . Drug use: Never  . Sexual activity: Not on file  Lifestyle  . Physical activity:    Days per week: Not on file    Minutes per session: Not on file  . Stress: Not on file  Relationships  . Social connections:    Talks on phone: Not on file    Gets together: Not on file    Attends religious service: Not on file    Active member of club or organization: Not on file    Attends meetings of clubs or organizations: Not on file    Relationship status: Not on file  . Intimate partner violence:    Fear of current or ex partner: Not on file    Emotionally abused: Not on file    Physically abused: Not on file    Forced sexual activity: Not on file  Other Topics Concern  . Not on file  Social History Narrative  . Not on file  No family history on file.  Past Surgical History:  Procedure Laterality Date  . BACK SURGERY      ROS: Review of Systems Neg except as above  PHYSICAL EXAM: BP 107/69   Pulse 75   Temp 98.5 F (36.9 C) (Oral)   Resp 16   Ht  (1.676 m)   Wt 167 lb (75.8 kg)   SpO2 95%   BMI 26.95 kg/m   Wt Readings from Last 3 Encounters:  08/19/17 167 lb (75.8 kg)  07/17/17 164 lb 12.8 oz (74.8 kg)  07/02/17 180 lb (81.6 kg)    Physical Exam  General appearance - alert, well appearing, and in no distress Mental status - normal mood, behavior, speech, dress, motor activity, and thought processes Neck - supple, no significant cervical or axillary adenopathy Chest - clear to auscultation, no wheezes, rales or rhonchi, symmetric air entry Heart - normal rate, regular rhythm, normal S1, S2, no murmurs, rubs, clicks or gallops Abdomen - soft,  nontender, no organomegaly Extremities - peripheral pulses normal, no pedal edema, no clubbing or cyanosis   ASSESSMENT AND PLAN: 1. Splenomegaly Plan to repeat CT scan of abd/pelvis  2. Dyspepsia - omeprazole (PRILOSEC) 20 MG capsule; Take 1 capsule (20 mg total) by mouth daily.  Dispense: 30 capsule; Refill: 3  Patient was given the opportunity to ask questions.  Patient verbalized understanding of the plan and was able to repeat key elements of the plan.  [employer] interpreter used during this encounter.  No orders of the defined types were placed in this encounter.    Requested Prescriptions   Signed Prescriptions Disp Refills  . omeprazole (PRILOSEC) 20 MG capsule 30 capsule 3    Sig: Take 1 capsule (20 mg total) by mouth daily.    Return in about 2 months (around 10/28/2017).  Jonah Blue, MD, FACP

## 2017-08-19 NOTE — Progress Notes (Signed)
Pt. Stated he get stomach pain sometimes. Pt. Stated when he bends over he get stomach pain or when he eats certain food and it dose not settle, he gets the discomfort.

## 2017-10-20 ENCOUNTER — Ambulatory Visit: Payer: Self-pay | Attending: Internal Medicine | Admitting: Internal Medicine

## 2017-10-20 ENCOUNTER — Encounter: Payer: Self-pay | Admitting: Internal Medicine

## 2017-10-20 VITALS — BP 115/75 | HR 92 | Temp 99.4°F | Resp 16 | Wt 172.6 lb

## 2017-10-20 DIAGNOSIS — Z8719 Personal history of other diseases of the digestive system: Secondary | ICD-10-CM | POA: Insufficient documentation

## 2017-10-20 DIAGNOSIS — R161 Splenomegaly, not elsewhere classified: Secondary | ICD-10-CM | POA: Insufficient documentation

## 2017-10-20 DIAGNOSIS — R590 Localized enlarged lymph nodes: Secondary | ICD-10-CM | POA: Insufficient documentation

## 2017-10-20 NOTE — Progress Notes (Signed)
Pt states his stomach has been upset and when he has a bowel movement there is only little balls

## 2017-10-20 NOTE — Progress Notes (Signed)
Patient ID: Orbie HurstJose David Ramirez Copeland, male    DOB: 08/30/1975  MRN: [id number]  CC: Follow-up (2 month )   Subjective: [name] is a 42 y.o. male who presents for f/u abnormal CT of abd/pelvis His concerns today include:   Pt was hosp 06/2017 with acute gastroenteritis and mononucleosis.  He had leukocytosis and found to have splenomegaly and few mildly enlarged left mesenteric nodes on CT scan.  Repeat CT scan of abd/pelvis in 3 mths suggested. Today he reports that "I am doing better now.  Not having that bother with my stomach as before." No wgh loss, fever. No N/V.  Takes omeprazole as needed for dyspepsia symptoms.   Patient Active Problem List   Diagnosis Date Noted  . Sepsis (HCC) 07/03/2017  . Nausea vomiting and diarrhea 07/03/2017  . Diarrhea 07/03/2017  . Abdominal pain 07/03/2017  . Splenomegaly 07/03/2017  . Mononucleosis 07/03/2017     Current Outpatient Medications on File Prior to Visit  Medication Sig Dispense Refill  . ibuprofen (ADVIL,MOTRIN) 200 MG tablet Take 200-600 mg by mouth every 6 (six) hours as needed for mild pain.    Marland Kitchen. menthol-cetylpyridinium (CEPACOL) 3 MG lozenge Take 1 lozenge (3 mg total) by mouth as needed for sore throat. (Patient not taking: Reported on 07/17/2017) 18 tablet 0  . omeprazole (PRILOSEC) 20 MG capsule Take 1 capsule (20 mg total) by mouth daily. 30 capsule 3   No current facility-administered medications on file prior to visit.     No Known Allergies  Social History   Socioeconomic History  . Marital status: Married    Spouse name: Not on file  . Number of children: Not on file  . Years of education: Not on file  . Highest education level: Not on file  Occupational History  . Not on file  Social Needs  . Financial resource strain: Not on file  . Food insecurity:    Worry: Not on file    Inability: Not on file  . Transportation needs:    Medical: Not on file    Non-medical: Not on file  Tobacco Use  .  Smoking status: Never Smoker  . Smokeless tobacco: Never Used  Substance and Sexual Activity  . Alcohol use: Never    Frequency: Never  . Drug use: Never  . Sexual activity: Not on file  Lifestyle  . Physical activity:    Days per week: Not on file    Minutes per session: Not on file  . Stress: Not on file  Relationships  . Social connections:    Talks on phone: Not on file    Gets together: Not on file    Attends religious service: Not on file    Active member of club or organization: Not on file    Attends meetings of clubs or organizations: Not on file    Relationship status: Not on file  . Intimate partner violence:    Fear of current or ex partner: Not on file    Emotionally abused: Not on file    Physically abused: Not on file    Forced sexual activity: Not on file  Other Topics Concern  . Not on file  Social History Narrative  . Not on file    No family history on file.  Past Surgical History:  Procedure Laterality Date  . BACK SURGERY      ROS: Review of Systems Negative except as above. PHYSICAL EXAM: BP 115/75  Pulse 92   Temp 99.4 F (37.4 C) (Oral)   Resp 16   Wt 172 lb 9.6 oz (78.3 kg)   SpO2 96%   BMI 27.86 kg/m   Wt Readings from Last 3 Encounters:  10/20/17 172 lb 9.6 oz (78.3 kg)  08/19/17 167 lb (75.8 kg)  07/17/17 164 lb 12.8 oz (74.8 kg)    Physical Exam General appearance - alert, well appearing, and in no distress Mental status - normal mood, behavior, speech, dress, motor activity, and thought processes Neck - supple, no significant cervical or axillary adenopathy Chest - clear to auscultation, no wheezes, rales or rhonchi, symmetric air entry Heart - normal rate, regular rhythm, normal S1, S2, no murmurs, rubs, clicks or gallops Abdomen - soft, nontender, no organomegaly  ASSESSMENT AND PLAN: 1. Splenomegaly 2. Lymphadenopathy, mesenteric I think both were probably due to the acute enteritis and mononucleosis that he had  going on at the time.  However repeat imaging in 3 months was recommended so we will go ahead and order. - CT Abdomen Pelvis W Contrast; Future - Basic Metabolic Panel  Patient was given the opportunity to ask questions.  Patient verbalized understanding of the plan and was able to repeat key elements of the plan.   Orders Placed This Encounter  Procedures  . CT Abdomen Pelvis W Contrast  . Basic Metabolic Panel     Requested Prescriptions    No prescriptions requested or ordered in this encounter    Return if symptoms worsen or fail to improve.  Jonah Blue, MD, FACP

## 2017-10-21 LAB — BASIC METABOLIC PANEL
BUN / CREAT RATIO: 18 (ref 9–20)
BUN: 14 mg/dL (ref 6–24)
CALCIUM: 9.6 mg/dL (ref 8.7–10.2)
CHLORIDE: 103 mmol/L (ref 96–106)
CO2: 24 mmol/L (ref 20–29)
Creatinine, Ser: 0.76 mg/dL (ref 0.76–1.27)
GFR calc non Af Amer: 113 mL/min/{1.73_m2} (ref >59)
GFR, EST AFRICAN AMERICAN: 130 mL/min/{1.73_m2} (ref >59)
GLUCOSE: 86 mg/dL (ref 65–99)
POTASSIUM: 4.3 mmol/L (ref 3.5–5.2)
Sodium: 142 mmol/L (ref 134–144)

## 2017-10-22 ENCOUNTER — Telehealth: Payer: Self-pay

## 2017-10-22 NOTE — Telephone Encounter (Signed)
[employer] interpre[employer] [place] [phone]#[id number]  contacted pt to go over lab results pt didn't answer left a detailed vm informing him of results and if he has any questions or concerns to give me a call   If pt calls back please give results: kidney function is normal from recent blood work.

## 2017-10-30 ENCOUNTER — Ambulatory Visit (HOSPITAL_COMMUNITY): Payer: Self-pay | Attending: Internal Medicine

## 2018-08-28 ENCOUNTER — Observation Stay (HOSPITAL_COMMUNITY)
Admission: EM | Admit: 2018-08-28 | Discharge: 2018-08-30 | Disposition: A | Discharge status: Home / Self Care | Payer: Self-pay | Attending: General Surgery | Admitting: General Surgery

## 2018-08-28 ENCOUNTER — Other Ambulatory Visit: Payer: Self-pay

## 2018-08-28 ENCOUNTER — Encounter (HOSPITAL_COMMUNITY): Payer: Self-pay | Admitting: Emergency Medicine

## 2018-08-28 DIAGNOSIS — K3580 Unspecified acute appendicitis: Secondary | ICD-10-CM

## 2018-08-28 DIAGNOSIS — Z1159 Encounter for screening for other viral diseases: Secondary | ICD-10-CM | POA: Insufficient documentation

## 2018-08-28 DIAGNOSIS — K3533 Acute appendicitis with perforation and localized peritonitis, with abscess: Principal | ICD-10-CM | POA: Insufficient documentation

## 2018-08-28 LAB — COMPREHENSIVE METABOLIC PANEL
ALT: 26 U/L (ref 0–44)
AST: 26 U/L (ref 15–41)
Albumin: 4.2 g/dL (ref 3.5–5.0)
Alkaline Phosphatase: 74 U/L (ref 38–126)
Anion gap: 6 (ref 5–15)
BUN: 14 mg/dL (ref 6–20)
CO2: 29 mmol/L (ref 22–32)
Calcium: 9 mg/dL (ref 8.9–10.3)
Chloride: 103 mmol/L (ref 98–111)
Creatinine, Ser: 0.97 mg/dL (ref 0.61–1.24)
GFR calc Af Amer: >60 mL/min (ref >60)
GFR calc non Af Amer: >60 mL/min (ref >60)
Glucose, Bld: 126 mg/dL — ABNORMAL HIGH (ref 70–99)
Potassium: 3.7 mmol/L (ref 3.5–5.1)
Sodium: 138 mmol/L (ref 135–145)
Total Bilirubin: 0.6 mg/dL (ref 0.3–1.2)
Total Protein: 7.7 g/dL (ref 6.5–8.1)

## 2018-08-28 LAB — DIFFERENTIAL
Basophils Absolute: 0.1 10*3/uL (ref 0.0–0.1)
Basophils Relative: 0 %
Eosinophils Absolute: 0.1 10*3/uL (ref 0.0–0.5)
Eosinophils Relative: 0 %
Lymphocytes Relative: 8 %
Lymphs Abs: 1.4 10*3/uL (ref 0.7–4.0)
Monocytes Absolute: 0.9 10*3/uL (ref 0.1–1.0)
Monocytes Relative: 5 %
Neutro Abs: 15.9 10*3/uL — ABNORMAL HIGH (ref 1.7–7.7)
Neutrophils Relative %: 86 %

## 2018-08-28 LAB — URINALYSIS, ROUTINE W REFLEX MICROSCOPIC
Bilirubin Urine: NEGATIVE
Glucose, UA: NEGATIVE mg/dL
Hgb urine dipstick: NEGATIVE
Ketones, ur: NEGATIVE mg/dL
Leukocytes,Ua: NEGATIVE
Nitrite: NEGATIVE
Protein, ur: NEGATIVE mg/dL
Specific Gravity, Urine: 1.015 (ref 1.005–1.030)
pH: 8 (ref 5.0–8.0)

## 2018-08-28 LAB — CBC
HCT: 41.1 % (ref 39.0–52.0)
Hemoglobin: 13.5 g/dL (ref 13.0–17.0)
MCH: 29 pg (ref 26.0–34.0)
MCHC: 32.8 g/dL (ref 30.0–36.0)
MCV: 88.2 fL (ref 80.0–100.0)
Platelets: 249 10*3/uL (ref 150–400)
RBC: 4.66 MIL/uL (ref 4.22–5.81)
RDW: 12.6 % (ref 11.5–15.5)
WBC: 18.3 10*3/uL — ABNORMAL HIGH (ref 4.0–10.5)
nRBC: 0 % (ref 0.0–0.2)

## 2018-08-28 LAB — LIPASE, BLOOD: Lipase: 24 U/L (ref 11–51)

## 2018-08-28 MED ORDER — MORPHINE SULFATE (PF) 4 MG/ML IV SOLN
4.0000 mg | Freq: Once | INTRAVENOUS | Status: AC
  Administered 2018-08-28: 4 mg via INTRAVENOUS
  Filled 2018-08-28: qty 1

## 2018-08-28 MED ORDER — METRONIDAZOLE IN NACL 5-0.79 MG/ML-% IV SOLN
500.0000 mg | Freq: Once | INTRAVENOUS | Status: AC
  Administered 2018-08-29: 500 mg via INTRAVENOUS
  Filled 2018-08-28: qty 100

## 2018-08-28 MED ORDER — SODIUM CHLORIDE 0.9% FLUSH: 3.0000 mL | Freq: Once | INTRAVENOUS | Status: DC

## 2018-08-28 MED ORDER — ONDANSETRON HCL 4 MG/2ML IJ SOLN
4.0000 mg | Freq: Once | INTRAMUSCULAR | Status: AC
  Administered 2018-08-28: 4 mg via INTRAVENOUS
  Filled 2018-08-28: qty 2

## 2018-08-28 MED ORDER — CEFTRIAXONE SODIUM 2 G IJ SOLR
2.0000 g | Freq: Once | INTRAMUSCULAR | Status: AC
  Administered 2018-08-28: 2 g via INTRAVENOUS
  Filled 2018-08-28: qty 20

## 2018-08-28 MED ORDER — SODIUM CHLORIDE 0.9 % IV BOLUS
1000.0000 mL | Freq: Once | INTRAVENOUS | Status: AC
  Administered 2018-08-28: 1000 mL via INTRAVENOUS

## 2018-08-28 NOTE — ED Triage Notes (Signed)
Pt c/o RLQ pain that started last night and has increased throughout the day. Denies nausea/vomiting/diarrhea/urinary symptoms.

## 2018-08-28 NOTE — ED Provider Notes (Signed)
[facility] EMERGENCY DEPARTMENT Provider Note   CSN: [id number] Arrival date & time: 08/28/18  2147    History   Chief Complaint Chief Complaint  Patient presents with  . Abdominal Pain    HPI Jared Copeland is a 43 y.o. male with a history of splenomegaly, mononucleosis, and colitis who presents to the emergency department with a chief complaint of abdominal pain.  The patient endorses mild right lower quadrant abdominal pain that began yesterday.  Pain has been constant and gradually worsening, until the pain began severe tonight and the patient could no longer tolerate it.  He characterizes the pain as stabbing and nonradiating.  He reports that when he bends over that he feels as if his abdomen is swollen.  He reports associated anorexia and urinary hesitancy since yesterday morning.  His last bowel movement was at 4 AM.  He denies of fever, chills, nausea, vomiting, diarrhea, penile or testicular pain or swelling, back pain, hematuria, dysuria, shortness of breath, or cough.  No treatment prior to arrival.  No known sick contacts.  No history of abdominal surgery.  No known or suspected COVID-19 contacts.     The history is provided by the patient. A language interpreter was used.    History reviewed. No pertinent past medical history.  Patient Active Problem List   Diagnosis Date Noted  . Acute appendicitis 08/29/2018  . Sepsis (HCC) 07/03/2017  . Nausea vomiting and diarrhea 07/03/2017  . Diarrhea 07/03/2017  . Abdominal pain 07/03/2017  . Splenomegaly 07/03/2017  . Mononucleosis 07/03/2017    Past Surgical History:  Procedure Laterality Date  . BACK SURGERY          Home Medications    Prior to Admission medications   Medication Sig Start Date End Date Taking? Authorizing Provider  ibuprofen (ADVIL,MOTRIN) 200 MG tablet Take 200-600 mg by mouth every 6 (six) hours as needed for mild pain.    [provider]   menthol-cetylpyridinium (CEPACOL) 3 MG lozenge Take 1 lozenge (3 mg total) by mouth as needed for sore throat. Patient not taking: Reported on 07/17/2017 07/04/17   Noralee Stain, DO  omeprazole (PRILOSEC) 20 MG capsule Take 1 capsule (20 mg total) by mouth daily. 08/19/17   Marcine Matar, MD    Family History No family history on file.  Social History Social History   Tobacco Use  . Smoking status: Never Smoker  . Smokeless tobacco: Never Used  Substance Use Topics  . Alcohol use: Never    Frequency: Never  . Drug use: Never     Allergies   Patient has no known allergies.   Review of Systems Review of Systems  Constitutional: Positive for appetite change. Negative for chills and fever.  Respiratory: Negative for shortness of breath.   Cardiovascular: Negative for chest pain.  Gastrointestinal: Positive for abdominal pain. Negative for constipation, diarrhea, nausea and vomiting.  Genitourinary: Negative for discharge, dysuria, flank pain, frequency, hematuria and penile pain.       Urinary hesitancy  Musculoskeletal: Negative for arthralgias, back pain and myalgias.  Skin: Negative for rash.  Allergic/Immunologic: Negative for immunocompromised state.  Neurological: Negative for dizziness, weakness, numbness and headaches.  Psychiatric/Behavioral: Negative for confusion.     Physical Exam Updated Vital Signs BP 127/74 (BP Location: Right Arm)   Pulse (!) 106   Temp (!) 102.8 F (39.3 C) (Oral) Comment: Nurse notified.  Resp 16   Ht 5\' 6"  (1.676 m)  Wt 78.9 kg   SpO2 95%   BMI 28.08 kg/m   Physical Exam Vitals signs and nursing note reviewed.  Constitutional:      Appearance: He is well-developed.     Comments: Uncomfortable appearing. Appears distressed with positional changes.  HENT:     Head: Normocephalic.  Eyes:     Conjunctiva/sclera: Conjunctivae normal.  Neck:     Musculoskeletal: Neck supple.  Cardiovascular:     Rate and Rhythm: Normal  rate and regular rhythm.     Heart sounds: No murmur.  Pulmonary:     Effort: Pulmonary effort is normal.  Abdominal:     General: Bowel sounds are normal. There is distension.     Palpations: Abdomen is soft.     Tenderness: There is abdominal tenderness in the right lower quadrant. There is guarding. There is no right CVA tenderness, left CVA tenderness or rebound. Positive signs include McBurney's sign and obturator sign. Negative signs include Murphy's sign and Rovsing's sign.     Comments: Abdomen is mildly distended, but stopped.  Focally tender to palpation in the right lower quadrant with maximal tenderness over McBurney's point.  He has a positive obturator sign.  No CVA tenderness bilaterally.  He appears somewhat uncomfortable with palpation of the right upper quadrant and left lower quadrant, but denies pain.  No rebound tenderness.  Skin:    General: Skin is warm and dry.  Neurological:     Mental Status: He is alert.  Psychiatric:        Behavior: Behavior normal.      ED Treatments / Results  Labs (all labs ordered are listed, but only abnormal results are displayed) Labs Reviewed  COMPREHENSIVE METABOLIC PANEL - Abnormal; Notable for the following components:      Result Value   Glucose, Bld 126 (*)    All other components within normal limits  CBC - Abnormal; Notable for the following components:   WBC 18.3 (*)    All other components within normal limits  DIFFERENTIAL - Abnormal; Notable for the following components:   Neutro Abs 15.9 (*)    All other components within normal limits  BASIC METABOLIC PANEL - Abnormal; Notable for the following components:   Glucose, Bld 132 (*)    Calcium 8.7 (*)    All other components within normal limits  SARS CORONAVIRUS 2 (HOSPITAL ORDER, PERFORMED IN [facility])  SURGICAL PCR SCREEN  LIPASE, BLOOD  URINALYSIS, ROUTINE W REFLEX MICROSCOPIC  CBC  HIV ANTIBODY (ROUTINE TESTING W REFLEX)    EKG None   Radiology Ct Abdomen Pelvis W Contrast  Result Date: 08/29/2018 CLINICAL DATA:  43 year old male with abdominal pain. Concern for acute appendicitis. EXAM: CT ABDOMEN AND PELVIS WITH CONTRAST TECHNIQUE: Multidetector CT imaging of the abdomen and pelvis was performed using the standard protocol following bolus administration of intravenous contrast. CONTRAST:  OMNIPAQUE IOHEXOL 300 MG/ML  SOLN COMPARISON:  CT of the abdomen pelvis dated 07/02/2017 FINDINGS: Lower chest: The visualized lung bases are clear. No intra-abdominal free air or free fluid. Hepatobiliary: No focal liver abnormality is seen. No gallstones, gallbladder wall thickening, or biliary dilatation. Pancreas: Unremarkable. No pancreatic ductal dilatation or surrounding inflammatory changes. Spleen: Normal in size without focal abnormality. Adrenals/Urinary Tract: Adrenal glands are unremarkable. Kidneys are normal, without renal calculi, focal lesion, or hydronephrosis. Bladder is unremarkable. Stomach/Bowel: There is no bowel obstruction. The appendix is enlarged and inflamed measuring 12 mm in thickness. The appendix is located  in the right lower quadrant inferior and medial to the cecum. There is enhancement of the appendiceal mucosa. No abscess or evidence of perforation. Vascular/Lymphatic: The abdominal aorta and IVC appear unremarkable. No portal venous gas. There is no adenopathy. Reproductive: The prostate and seminal vesicles are grossly unremarkable. No pelvic mass. Other: None Musculoskeletal: L5-S1 interbody and posterior fusion as well as anterior fusion similar to prior CT. No acute osseous pathology. IMPRESSION: Acute appendicitis. No abscess or perforation. Electronically Signed   By: Elgie Collard M.D.   On: 08/29/2018 01:11    Procedures Procedures (including critical care time)  Medications Ordered in ED Medications  sodium chloride flush (NS) 0.9 % injection 3 mL (3 mLs Intravenous Not Given 08/28/18 2222)   enoxaparin (LOVENOX) injection 40 mg (has no administration in time range)  cefTRIAXone (ROCEPHIN) 2 g in sodium chloride 0.9 % 100 mL IVPB (has no administration in time range)    And  metroNIDAZOLE (FLAGYL) IVPB 500 mg (has no administration in time range)  acetaminophen (TYLENOL) tablet 1,000 mg (1,000 mg Oral Given 08/29/18 0253)  traMADol (ULTRAM) tablet 50 mg (has no administration in time range)  ibuprofen (ADVIL) tablet 600 mg (has no administration in time range)  HYDROmorphone (DILAUDID) injection 0.5 mg (0.5 mg Intravenous Given 08/29/18 0330)  diphenhydrAMINE (BENADRYL) capsule 25 mg (has no administration in time range)    Or  diphenhydrAMINE (BENADRYL) injection 25 mg (has no administration in time range)  docusate sodium (COLACE) capsule 100 mg (100 mg Oral Not Given 08/29/18 0342)  ondansetron (ZOFRAN-ODT) disintegrating tablet 4 mg (has no administration in time range)    Or  ondansetron (ZOFRAN) injection 4 mg (has no administration in time range)  metoprolol tartrate (LOPRESSOR) injection 5 mg (has no administration in time range)  hydrALAZINE (APRESOLINE) injection 10 mg (has no administration in time range)  morphine 4 MG/ML injection 4 mg (4 mg Intravenous Given 08/28/18 2335)  ondansetron (ZOFRAN) injection 4 mg (4 mg Intravenous Given 08/28/18 2335)  cefTRIAXone (ROCEPHIN) 2 g in sodium chloride 0.9 % 100 mL IVPB (0 g Intravenous Stopped 08/29/18 0050)    And  metroNIDAZOLE (FLAGYL) IVPB 500 mg (0 mg Intravenous Stopped 08/29/18 0200)  sodium chloride 0.9 % bolus 1,000 mL (0 mLs Intravenous Stopped 08/29/18 0050)  iohexol (OMNIPAQUE) 300 MG/ML solution 100 mL (100 mLs Intravenous Contrast Given 08/29/18 0023)     Initial Impression / Assessment and Plan / ED Course  I have reviewed the triage vital signs and the nursing notes.  Pertinent labs & imaging results that were available during my care of the patient were reviewed by me and considered in my medical decision making (see  chart for details).        43 year old male with a history of splenomegaly, mononucleosis, and colitis presenting with right lower quadrant abdominal pain, urinary hesitancy, and anorexia, onset yesterday morning.  No constitutional symptoms.  No known sick contacts.  No history of abdominal surgery.  No treatment for symptoms prior to arrival.  Vital signs on arrival are normal.  On exam, the patient is focally tender in the right lower quadrant with maximal tenderness over McBurney's point.  He has a positive obturator sign.  He does appear uncomfortable with palpation of the right upper quadrant and left lower quadrant, but denies pain.  Given his presentation, I am most concerned for appendicitis, particularly since the patient has a leukocytosis of 18.3.  Urinalysis does not appear infectious so lower suspicion for UTI, pyelonephritis,  or obstructive uropathy.  Differential diagnosis also includes cholecystitis, diverticulitis.  Lower suspicion for testicular torsion given that the patient does not have any testicular complaints.   Metabolic panel is pending.  Morphine, Zofran, and IV fluids have been ordered.  CT abdomen pelvis is pending.  Given concern for surgical abdomen, COVID-19 swab has been ordered.  Rocephin and Flagyl ordered given high clinical suspicion for appendicitis.  No metabolic derangements.  COVID-19 is negative.  CT abdomen pelvis demonstrates acute appendicitis without perforation or abscess.  Consulted general surgery and spoke with Dr. Fredricka Bonineonnor who will admit the patient. The patient appears reasonably stabilized for admission considering the current resources, flow, and capabilities available in the ED at this time, and I doubt any other [facility] requiring further screening and/or treatment in the ED prior to admission.   Final Clinical Impressions(s) / ED Diagnoses   Final diagnoses:  Acute appendicitis, unspecified acute appendicitis type    ED Discharge Orders     None       Barkley BoardsMcDonald, Idamay Hosein A, PA-C 08/29/18 0409    Little, Ambrose Finlandachel Morgan, MD 08/29/18 1706

## 2018-08-29 ENCOUNTER — Emergency Department (HOSPITAL_COMMUNITY): Payer: Self-pay

## 2018-08-29 ENCOUNTER — Observation Stay (HOSPITAL_COMMUNITY): Payer: Self-pay | Admitting: Certified Registered"

## 2018-08-29 ENCOUNTER — Encounter (HOSPITAL_COMMUNITY)
Admission: EM | Disposition: A | Discharge status: Home / Self Care | Payer: Self-pay | Source: Home / Self Care | Attending: Emergency Medicine

## 2018-08-29 ENCOUNTER — Other Ambulatory Visit: Payer: Self-pay

## 2018-08-29 DIAGNOSIS — K3580 Unspecified acute appendicitis: Secondary | ICD-10-CM | POA: Diagnosis present

## 2018-08-29 HISTORY — PX: LAPAROSCOPIC APPENDECTOMY: SHX408

## 2018-08-29 LAB — CBC
HCT: 40.4 % (ref 39.0–52.0)
Hemoglobin: 13.3 g/dL (ref 13.0–17.0)
MCH: 29.5 pg (ref 26.0–34.0)
MCHC: 32.9 g/dL (ref 30.0–36.0)
MCV: 89.6 fL (ref 80.0–100.0)
Platelets: 215 10*3/uL (ref 150–400)
RBC: 4.51 MIL/uL (ref 4.22–5.81)
RDW: 12.8 % (ref 11.5–15.5)
WBC: 8.1 10*3/uL (ref 4.0–10.5)
nRBC: 0 % (ref 0.0–0.2)

## 2018-08-29 LAB — BASIC METABOLIC PANEL
Anion gap: 12 (ref 5–15)
BUN: 12 mg/dL (ref 6–20)
CO2: 24 mmol/L (ref 22–32)
Calcium: 8.7 mg/dL — ABNORMAL LOW (ref 8.9–10.3)
Chloride: 105 mmol/L (ref 98–111)
Creatinine, Ser: 0.9 mg/dL (ref 0.61–1.24)
GFR calc Af Amer: >60 mL/min (ref >60)
GFR calc non Af Amer: >60 mL/min (ref >60)
Glucose, Bld: 132 mg/dL — ABNORMAL HIGH (ref 70–99)
Potassium: 3.8 mmol/L (ref 3.5–5.1)
Sodium: 141 mmol/L (ref 135–145)

## 2018-08-29 LAB — SURGICAL PCR SCREEN
MRSA, PCR: NEGATIVE
Staphylococcus aureus: NEGATIVE

## 2018-08-29 LAB — SARS CORONAVIRUS 2 BY RT PCR (HOSPITAL ORDER, PERFORMED IN ~~LOC~~ HOSPITAL LAB): SARS Coronavirus 2: NEGATIVE

## 2018-08-29 SURGERY — APPENDECTOMY, LAPAROSCOPIC
Anesthesia: General | Site: Abdomen

## 2018-08-29 MED ORDER — PROPOFOL 1000 MG/100ML IV EMUL
INTRAVENOUS | Status: AC
  Filled 2018-08-29: qty 100

## 2018-08-29 MED ORDER — DOCUSATE SODIUM 100 MG PO CAPS
100.0000 mg | ORAL_CAPSULE | Freq: Two times a day (BID) | ORAL | Status: DC
  Administered 2018-08-29 – 2018-08-30 (×2): 100 mg via ORAL
  Filled 2018-08-29 (×2): qty 1

## 2018-08-29 MED ORDER — IOHEXOL 300 MG/ML  SOLN
100.0000 mL | Freq: Once | INTRAMUSCULAR | Status: AC | PRN
  Administered 2018-08-29: 100 mL via INTRAVENOUS

## 2018-08-29 MED ORDER — LIDOCAINE 2% (20 MG/ML) 5 ML SYRINGE
INTRAMUSCULAR | Status: DC | PRN
  Administered 2018-08-29: 60 mg via INTRAVENOUS

## 2018-08-29 MED ORDER — ENOXAPARIN SODIUM 40 MG/0.4ML ~~LOC~~ SOLN
40.0000 mg | SUBCUTANEOUS | Status: DC
  Administered 2018-08-30: 40 mg via SUBCUTANEOUS
  Filled 2018-08-29: qty 0.4

## 2018-08-29 MED ORDER — MIDAZOLAM HCL 2 MG/2ML IJ SOLN
INTRAMUSCULAR | Status: AC
  Filled 2018-08-29: qty 2

## 2018-08-29 MED ORDER — BUPIVACAINE-EPINEPHRINE (PF) 0.25% -1:200000 IJ SOLN
INTRAMUSCULAR | Status: AC
  Filled 2018-08-29: qty 30

## 2018-08-29 MED ORDER — FENTANYL CITRATE (PF) 250 MCG/5ML IJ SOLN
INTRAMUSCULAR | Status: AC
  Filled 2018-08-29: qty 5

## 2018-08-29 MED ORDER — SODIUM CHLORIDE 0.9 % IR SOLN
Status: DC | PRN
  Administered 2018-08-29: 1000 mL

## 2018-08-29 MED ORDER — SODIUM CHLORIDE 0.9 % IV SOLN
INTRAVENOUS | Status: DC | PRN
  Administered 2018-08-29: 10:00:00 30 ug/min via INTRAVENOUS

## 2018-08-29 MED ORDER — IBUPROFEN 600 MG PO TABS: 600.0000 mg | ORAL_TABLET | Freq: Four times a day (QID) | ORAL | Status: DC | PRN

## 2018-08-29 MED ORDER — ACETAMINOPHEN 500 MG PO TABS
1000.0000 mg | ORAL_TABLET | Freq: Four times a day (QID) | ORAL | Status: DC
  Administered 2018-08-29 – 2018-08-30 (×3): 1000 mg via ORAL
  Filled 2018-08-29 (×3): qty 2

## 2018-08-29 MED ORDER — KETOROLAC TROMETHAMINE 30 MG/ML IJ SOLN
30.0000 mg | Freq: Three times a day (TID) | INTRAMUSCULAR | Status: DC
  Administered 2018-08-29 – 2018-08-30 (×2): 30 mg via INTRAVENOUS
  Filled 2018-08-29 (×2): qty 1

## 2018-08-29 MED ORDER — PROPOFOL 10 MG/ML IV BOLUS
INTRAVENOUS | Status: DC | PRN
  Administered 2018-08-29: 160 mg via INTRAVENOUS

## 2018-08-29 MED ORDER — OXYCODONE HCL 5 MG PO TABS
5.0000 mg | ORAL_TABLET | ORAL | Status: DC | PRN
  Administered 2018-08-29: 10 mg via ORAL
  Filled 2018-08-29: qty 2

## 2018-08-29 MED ORDER — FENTANYL CITRATE (PF) 100 MCG/2ML IJ SOLN
INTRAMUSCULAR | Status: DC | PRN
  Administered 2018-08-29: 50 ug via INTRAVENOUS
  Administered 2018-08-29: 100 ug via INTRAVENOUS
  Administered 2018-08-29: 50 ug via INTRAVENOUS
  Administered 2018-08-29: 25 ug via INTRAVENOUS

## 2018-08-29 MED ORDER — ONDANSETRON 4 MG PO TBDP: 4.0000 mg | ORAL_TABLET | Freq: Four times a day (QID) | ORAL | Status: DC | PRN

## 2018-08-29 MED ORDER — DEXAMETHASONE SODIUM PHOSPHATE 10 MG/ML IJ SOLN
INTRAMUSCULAR | Status: DC | PRN
  Administered 2018-08-29: 5 mg via INTRAVENOUS

## 2018-08-29 MED ORDER — TRAMADOL HCL 50 MG PO TABS: 50.0000 mg | ORAL_TABLET | Freq: Four times a day (QID) | ORAL | Status: DC | PRN

## 2018-08-29 MED ORDER — DIPHENHYDRAMINE HCL 25 MG PO CAPS: 25.0000 mg | ORAL_CAPSULE | Freq: Four times a day (QID) | ORAL | Status: DC | PRN

## 2018-08-29 MED ORDER — MIDAZOLAM HCL 5 MG/5ML IJ SOLN
INTRAMUSCULAR | Status: DC | PRN
  Administered 2018-08-29: 2 mg via INTRAVENOUS

## 2018-08-29 MED ORDER — METRONIDAZOLE IN NACL 5-0.79 MG/ML-% IV SOLN
500.0000 mg | Freq: Three times a day (TID) | INTRAVENOUS | Status: DC
  Administered 2018-08-29: 500 mg via INTRAVENOUS
  Filled 2018-08-29: qty 100

## 2018-08-29 MED ORDER — ONDANSETRON HCL 4 MG/2ML IJ SOLN: 4.0000 mg | Freq: Four times a day (QID) | INTRAMUSCULAR | Status: DC | PRN

## 2018-08-29 MED ORDER — HYDROMORPHONE HCL 1 MG/ML IJ SOLN
0.5000 mg | INTRAMUSCULAR | Status: DC | PRN
  Administered 2018-08-29: 0.5 mg via INTRAVENOUS
  Filled 2018-08-29: qty 1

## 2018-08-29 MED ORDER — KETOROLAC TROMETHAMINE 30 MG/ML IJ SOLN
INTRAMUSCULAR | Status: DC | PRN
  Administered 2018-08-29: 30 mg via INTRAVENOUS

## 2018-08-29 MED ORDER — SUGAMMADEX SODIUM 200 MG/2ML IV SOLN
INTRAVENOUS | Status: DC | PRN
  Administered 2018-08-29: 180 mg via INTRAVENOUS

## 2018-08-29 MED ORDER — HYDRALAZINE HCL 20 MG/ML IJ SOLN: 10.0000 mg | INTRAMUSCULAR | Status: DC | PRN

## 2018-08-29 MED ORDER — SUCCINYLCHOLINE CHLORIDE 200 MG/10ML IV SOSY
PREFILLED_SYRINGE | INTRAVENOUS | Status: DC | PRN
  Administered 2018-08-29: 100 mg via INTRAVENOUS

## 2018-08-29 MED ORDER — BUPIVACAINE-EPINEPHRINE 0.25% -1:200000 IJ SOLN
INTRAMUSCULAR | Status: DC | PRN
  Administered 2018-08-29: 23 mL

## 2018-08-29 MED ORDER — HYDROMORPHONE HCL 1 MG/ML IJ SOLN: 1.0000 mg | INTRAMUSCULAR | Status: DC | PRN

## 2018-08-29 MED ORDER — SODIUM CHLORIDE 0.9 % IV SOLN
2.0000 g | INTRAVENOUS | Status: DC
  Filled 2018-08-29: qty 20

## 2018-08-29 MED ORDER — METOPROLOL TARTRATE 5 MG/5ML IV SOLN: 5.0000 mg | Freq: Four times a day (QID) | INTRAVENOUS | Status: DC | PRN

## 2018-08-29 MED ORDER — LACTATED RINGERS IV SOLN
INTRAVENOUS | Status: DC | PRN
  Administered 2018-08-29: 09:00:00 via INTRAVENOUS

## 2018-08-29 MED ORDER — PROPOFOL 10 MG/ML IV BOLUS
INTRAVENOUS | Status: AC
  Filled 2018-08-29: qty 40

## 2018-08-29 MED ORDER — 0.9 % SODIUM CHLORIDE (POUR BTL) OPTIME
TOPICAL | Status: DC | PRN
  Administered 2018-08-29: 08:00:00 1000 mL

## 2018-08-29 MED ORDER — ROCURONIUM BROMIDE 10 MG/ML (PF) SYRINGE
PREFILLED_SYRINGE | INTRAVENOUS | Status: DC | PRN
  Administered 2018-08-29: 50 mg via INTRAVENOUS

## 2018-08-29 MED ORDER — DIPHENHYDRAMINE HCL 50 MG/ML IJ SOLN: 25.0000 mg | Freq: Four times a day (QID) | INTRAMUSCULAR | Status: DC | PRN

## 2018-08-29 SURGICAL SUPPLY — 53 items
APPLIER CLIP ROT 10 11.4 M/L (STAPLE)
BANDAGE ADH SHEER 1  50/CT (GAUZE/BANDAGES/DRESSINGS) IMPLANT
BENZOIN TINCTURE PRP APPL 2/3 (GAUZE/BANDAGES/DRESSINGS) IMPLANT
BLADE CLIPPER SURG (BLADE) ×3 IMPLANT
CANISTER SUCT 3000ML PPV (MISCELLANEOUS) ×3 IMPLANT
CHLORAPREP W/TINT 26ML (MISCELLANEOUS) ×3 IMPLANT
CLIP APPLIE ROT 10 11.4 M/L (STAPLE) IMPLANT
CLOSURE WOUND 1/2 X4 (GAUZE/BANDAGES/DRESSINGS)
COVER SURGICAL LIGHT HANDLE (MISCELLANEOUS) ×3 IMPLANT
COVER WAND RF STERILE (DRAPES) ×3 IMPLANT
CUTTER FLEX LINEAR 45M (STAPLE) ×3 IMPLANT
DERMABOND ADVANCED (GAUZE/BANDAGES/DRESSINGS) ×2
DERMABOND ADVANCED .7 DNX12 (GAUZE/BANDAGES/DRESSINGS) ×1 IMPLANT
DRSG TEGADERM 4X4.75 (GAUZE/BANDAGES/DRESSINGS) IMPLANT
ELECT REM PT RETURN 9FT ADLT (ELECTROSURGICAL) ×3
ELECTRODE REM PT RTRN 9FT ADLT (ELECTROSURGICAL) ×1 IMPLANT
ENDOLOOP SUT PDS II  0 18 (SUTURE)
ENDOLOOP SUT PDS II 0 18 (SUTURE) IMPLANT
GAUZE SPONGE 2X2 8PLY STRL LF (GAUZE/BANDAGES/DRESSINGS) IMPLANT
GLOVE BIOGEL M STRL SZ7.5 (GLOVE) ×3 IMPLANT
GLOVE INDICATOR 8.0 STRL GRN (GLOVE) ×6 IMPLANT
GOWN STRL REUS W/ TWL LRG LVL3 (GOWN DISPOSABLE) ×2 IMPLANT
GOWN STRL REUS W/TWL 2XL LVL3 (GOWN DISPOSABLE) ×3 IMPLANT
GOWN STRL REUS W/TWL LRG LVL3 (GOWN DISPOSABLE) ×4
GRASPER SUT TROCAR 14GX15 (MISCELLANEOUS) IMPLANT
KIT BASIN OR (CUSTOM PROCEDURE TRAY) ×3 IMPLANT
KIT TURNOVER KIT B (KITS) ×3 IMPLANT
NS IRRIG 1000ML POUR BTL (IV SOLUTION) ×3 IMPLANT
PAD ARMBOARD 7.5X6 YLW CONV (MISCELLANEOUS) ×6 IMPLANT
POUCH RETRIEVAL ECOSAC 10 (ENDOMECHANICALS) ×1 IMPLANT
POUCH RETRIEVAL ECOSAC 10MM (ENDOMECHANICALS) ×2
RELOAD 45 VASCULAR/THIN (ENDOMECHANICALS) IMPLANT
RELOAD STAPLE TA45 3.5 REG BLU (ENDOMECHANICALS) ×3 IMPLANT
RELOAD STAPLER BLUE 60MM (STAPLE) ×1 IMPLANT
SCISSORS LAP 5X35 DISP (ENDOMECHANICALS) ×3 IMPLANT
SET IRRIG TUBING LAPAROSCOPIC (IRRIGATION / IRRIGATOR) ×3 IMPLANT
SET TUBE SMOKE EVAC HIGH FLOW (TUBING) ×3 IMPLANT
SHEARS HARMONIC ACE PLUS 36CM (ENDOMECHANICALS) ×3 IMPLANT
SLEEVE ENDOPATH XCEL 5M (ENDOMECHANICALS) ×3 IMPLANT
SPECIMEN JAR SMALL (MISCELLANEOUS) ×3 IMPLANT
SPONGE GAUZE 2X2 STER 10/PKG (GAUZE/BANDAGES/DRESSINGS)
STAPLE ECHEON FLEX 60 POW ENDO (STAPLE) ×3 IMPLANT
STAPLER RELOAD BLUE 60MM (STAPLE) ×3
STRIP CLOSURE SKIN 1/2X4 (GAUZE/BANDAGES/DRESSINGS) IMPLANT
SUT MNCRL AB 4-0 PS2 18 (SUTURE) ×3 IMPLANT
SUT VICRYL 0 UR6 27IN ABS (SUTURE) IMPLANT
TOWEL OR 17X24 6PK STRL BLUE (TOWEL DISPOSABLE) ×3 IMPLANT
TOWEL OR 17X26 10 PK STRL BLUE (TOWEL DISPOSABLE) ×3 IMPLANT
TRAY FOLEY CATH SILVER 16FR (SET/KITS/TRAYS/PACK) ×3 IMPLANT
TRAY LAPAROSCOPIC MC (CUSTOM PROCEDURE TRAY) ×3 IMPLANT
TROCAR XCEL BLUNT TIP 100MML (ENDOMECHANICALS) ×3 IMPLANT
TROCAR XCEL NON-BLD 5MMX100MML (ENDOMECHANICALS) ×3 IMPLANT
WATER STERILE IRR 1000ML POUR (IV SOLUTION) ×3 IMPLANT

## 2018-08-29 NOTE — Anesthesia Postprocedure Evaluation (Signed)
Anesthesia Post Note  Patient: Hamilton Marinello  Procedure(s) Performed: APPENDECTOMY LAPAROSCOPIC (N/A Abdomen)     Patient location during evaluation: PACU Anesthesia Type: General Level of consciousness: awake and alert Pain management: pain level controlled Vital Signs Assessment: post-procedure vital signs reviewed and stable Respiratory status: spontaneous breathing, nonlabored ventilation, respiratory function stable and patient connected to nasal cannula oxygen Cardiovascular status: blood pressure returned to baseline and stable Postop Assessment: no apparent nausea or vomiting Anesthetic complications: no    Last Vitals:  Vitals:   08/29/18 1215 08/29/18 1240  BP: 102/68 99/63  Pulse: 85 84  Resp: 20 20  Temp: 36.9 C 36.8 C  SpO2: 96% 96%    Last Pain:  Vitals:   08/29/18 1200  TempSrc:   PainSc: 0-No pain                 Sharell Hilmer

## 2018-08-29 NOTE — ED Notes (Signed)
Patient transported to CT 

## 2018-08-29 NOTE — Plan of Care (Signed)
  Problem: Education: Goal: Knowledge of General Education information will improve Description Including pain rating scale, medication(s)/side effects and non-pharmacologic comfort measures Outcome: Progressing   

## 2018-08-29 NOTE — H&P (Addendum)
Surgical H&P  CC: abdominal pain  HPI: Video interpreter used for this encounter.  This is a very nice Spanish-speaking gentleman from [place] who presents with worsening right lower quadrant abdominal pain.  This began yesterday and was mild initially.  It progressively worsened over the course of the evening and this morning and became unbearable tonight prompting him to come to the emergency room.  He denies associated fever, nausea, vomiting, bloating or diarrhea but does note constipation.  No prior similar symptoms.  Previous abdominal surgery includes abdominal access for [place].  He had been working at [employer] but is currently not employed.  He requests a work note when he is discharged in case his employer is able to rehire him.  No Known Allergies  History reviewed. No pertinent past medical history.  Past Surgical History:  Procedure Laterality Date  . BACK SURGERY      No family history on file.  Social History   Socioeconomic History  . Marital status: Significant Other    Spouse name: Not on file  . Number of children: Not on file  . Years of education: Not on file  . Highest education level: Not on file  Occupational History  . Not on file  Social Needs  . Financial resource strain: Not on file  . Food insecurity:    Worry: Not on file    Inability: Not on file  . Transportation needs:    Medical: Not on file    Non-medical: Not on file  Tobacco Use  . Smoking status: Never Smoker  . Smokeless tobacco: Never Used  Substance and Sexual Activity  . Alcohol use: Never    Frequency: Never  . Drug use: Never  . Sexual activity: Not on file  Lifestyle  . Physical activity:    Days per week: Not on file    Minutes per session: Not on file  . Stress: Not on file  Relationships  . Social connections:    Talks on phone: Not on file    Gets together: Not on file    Attends religious service: Not on file    Active  member of club or organization: Not on file    Attends meetings of clubs or organizations: Not on file    Relationship status: Not on file  Other Topics Concern  . Not on file  Social History Narrative  . Not on file    No current facility-administered medications on file prior to encounter.    Current Outpatient Medications on File Prior to Encounter  Medication Sig Dispense Refill  . ibuprofen (ADVIL,MOTRIN) 200 MG tablet Take 200-600 mg by mouth every 6 (six) hours as needed for mild pain.    Marland Kitchen. menthol-cetylpyridinium (CEPACOL) 3 MG lozenge Take 1 lozenge (3 mg total) by mouth as needed for sore throat. (Patient not taking: Reported on 07/17/2017) 18 tablet 0  . omeprazole (PRILOSEC) 20 MG capsule Take 1 capsule (20 mg total) by mouth daily. 30 capsule 3    Review of Systems: a complete, 10pt review of systems was completed with pertinent positives and negatives as documented in the HPI  Physical Exam: Vitals:   08/29/18 0015 08/29/18 0130  BP: 129/78 129/73  Pulse: 80 86  Resp:    Temp:    SpO2: 99% 96%   Gen: A&Ox3, no distress  Head: normocephalic, atraumatic Eyes: extraocular motions intact, anicteric.  Neck: supple without mass or thyromegaly Chest: unlabored respirations, symmetrical  air entry, clear bilaterally   Cardiovascular: RRR with palpable distal pulses, no pedal edema Abdomen: soft, nondistended, exquisitely tender in the right lower quadrant with voluntary guarding.  Mild tenderness in the suprapubic and infraumbilical field. No mass or organomegaly.  There is a well-healed oblique left lower quadrant scar. Extremities: warm, without edema, no deformities  Neuro: grossly intact Psych: appropriate mood and affect, normal insight  Skin: warm and dry   CBC Latest Ref Rng & Units 08/28/2018 07/17/2017 07/04/2017  WBC 4.0 - 10.5 K/uL 18.3(H) 12.0(H) 14.5(H)  Hemoglobin 13.0 - 17.0 g/dL 96.013.5 12.4(L) 11.5(L)  Hematocrit 39.0 - 52.0 % 41.1 38.0 35.3(L)  Platelets  150 - 400 K/uL 249 339 257    CMP Latest Ref Rng & Units 08/28/2018 10/20/2017 07/04/2017  Glucose 70 - 99 mg/dL 454(U126(H) 86 84  BUN 6 - 20 mg/dL 14 14 7   Creatinine 0.61 - 1.24 mg/dL 9.810.97 1.910.76 4.780.85  Sodium 135 - 145 mmol/L 138 142 136  Potassium 3.5 - 5.1 mmol/L 3.7 4.3 4.1  Chloride 98 - 111 mmol/L 103 103 102  CO2 22 - 32 mmol/L 29 24 27   Calcium 8.9 - 10.3 mg/dL 9.0 9.6 8.1(L)  Total Protein 6.5 - 8.1 g/dL 7.7 - -  Total Bilirubin 0.3 - 1.2 mg/dL 0.6 - -  Alkaline Phos 38 - 126 U/L 74 - -  AST 15 - 41 U/L 26 - -  ALT 0 - 44 U/L 26 - -    Lab Results  Component Value Date   INR 1.23 07/03/2017    Imaging: Ct Abdomen Pelvis W Contrast  Result Date: 08/29/2018 CLINICAL DATA:  43 year old male with abdominal pain. Concern for acute appendicitis. EXAM: CT ABDOMEN AND PELVIS WITH CONTRAST TECHNIQUE: Multidetector CT imaging of the abdomen and pelvis was performed using the standard protocol following bolus administration of intravenous contrast. CONTRAST:  100mL OMNIPAQUE IOHEXOL 300 MG/ML  SOLN COMPARISON:  CT of the abdomen pelvis dated 07/02/2017 FINDINGS: Lower chest: The visualized lung bases are clear. No intra-abdominal free air or free fluid. Hepatobiliary: No focal liver abnormality is seen. No gallstones, gallbladder wall thickening, or biliary dilatation. Pancreas: Unremarkable. No pancreatic ductal dilatation or surrounding inflammatory changes. Spleen: Normal in size without focal abnormality. Adrenals/Urinary Tract: Adrenal glands are unremarkable. Kidneys are normal, without renal calculi, focal lesion, or hydronephrosis. Bladder is unremarkable. Stomach/Bowel: There is no bowel obstruction. The appendix is enlarged and inflamed measuring 12 mm in thickness. The appendix is located in the right lower quadrant inferior and medial to the cecum. There is enhancement of the appendiceal mucosa. No abscess or evidence of perforation. Vascular/Lymphatic: The abdominal aorta and IVC appear  unremarkable. No portal venous gas. There is no adenopathy. Reproductive: The prostate and seminal vesicles are grossly unremarkable. No pelvic mass. Other: None Musculoskeletal: L5-S1 interbody and posterior fusion as well as anterior fusion similar to prior CT. No acute osseous pathology. IMPRESSION: Acute appendicitis. No abscess or perforation. Electronically Signed   By: Elgie CollardArash  Radparvar M.D.   On: 08/29/2018 01:11      A/P: Acute appendicitis. I recommend laparoscopic appendectomy. We discussed the surgery including risks of bleeding, infection, pain, scarring, injury to intra-abdominal structures, conversion to open surgery or more extensive resection, risk of staple line leak or delayed abscess, failure to resolve symptoms, postoperative ileus, incisional hernia, as well as general risks of DVT/PE, pneumonia, stroke, heart attack, death. Questions were welcomed and answered to the patient's satisfaction. Will plan for surgery later this AM  with Dr. Redmond Pulling.  COVID test has been collected but is still in process as of the writing of this note at 2:24 AM..    Romana Juniper, MD [facility], [place] Pager [phone]

## 2018-08-29 NOTE — Anesthesia Preprocedure Evaluation (Signed)
Anesthesia Evaluation  Patient identified by MRN, date of birth, ID band Patient awake    Reviewed: Allergy & Precautions, NPO status , Patient's Chart, lab work & pertinent test results  History of Anesthesia Complications Negative for: history of anesthetic complications  Airway Mallampati: II  TM Distance: >3 FB Neck ROM: Full    Dental  (+) Teeth Intact   Pulmonary neg pulmonary ROS,    breath sounds clear to auscultation       Cardiovascular negative cardio ROS   Rhythm:Regular     Neuro/Psych negative neurological ROS  negative psych ROS   GI/Hepatic Neg liver ROS,   Endo/Other  negative endocrine ROS  Renal/GU negative Renal ROS     Musculoskeletal negative musculoskeletal ROS (+)   Abdominal   Peds  Hematology negative hematology ROS (+)   Anesthesia Other Findings   Reproductive/Obstetrics                             Anesthesia Physical Anesthesia Plan  ASA: I  Anesthesia Plan: General   Post-op Pain Management:    Induction: Intravenous, Rapid sequence and Cricoid pressure planned  PONV Risk Score and Plan: 2 and Ondansetron and Dexamethasone  Airway Management Planned: Oral ETT  Additional Equipment: None  Intra-op Plan:   Post-operative Plan: Extubation in OR  Informed Consent: I have reviewed the patients History and Physical, chart, labs and discussed the procedure including the risks, benefits and alternatives for the proposed anesthesia with the patient or authorized representative who has indicated his/her understanding and acceptance.     Dental advisory given  Plan Discussed with: CRNA and Surgeon  Anesthesia Plan Comments:         Anesthesia Quick Evaluation

## 2018-08-29 NOTE — Transfer of Care (Signed)
Immediate Anesthesia Transfer of Care Note  Patient: Jared Copeland  Procedure(s) Performed: APPENDECTOMY LAPAROSCOPIC (N/A Abdomen)  Patient Location: PACU  Anesthesia Type:General  Level of Consciousness: awake, alert , oriented and patient cooperative  Airway & Oxygen Therapy: Patient Spontanous Breathing and Patient connected to face mask oxygen  Post-op Assessment: Report given to RN and Post -op Vital signs reviewed and stable  Post vital signs: Reviewed and stable  Last Vitals:  Vitals Value Taken Time  BP 107/58 08/29/2018 11:30 AM  Temp 36.3 C 08/29/2018 11:30 AM  Pulse 94 08/29/2018 11:33 AM  Resp 15 08/29/2018 11:33 AM  SpO2 100 % 08/29/2018 11:33 AM  Vitals shown include unvalidated device data.  Last Pain:  Vitals:   08/29/18 1130  TempSrc:   PainSc: Asleep         Complications: No apparent anesthesia complications

## 2018-08-29 NOTE — Interval H&P Note (Signed)
History and Physical Interval Note:  08/29/2018 7:53 AM  [place]  has presented today for surgery, with the diagnosis of ACUTE APPENDICITIS.  The various methods of treatment have been discussed with the patient and family. After consideration of risks, benefits and other options for treatment, the patient has consented to  Procedure(s): APPENDECTOMY LAPAROSCOPIC (N/A) as a surgical intervention.  The patient's history has been reviewed, patient examined, no change in status, stable for surgery.  I have reviewed the patient's chart and labs.  Questions were answered to the patient's satisfaction.    Via video interpreter  We discussed the etiology and management of acute appendicitis. We discussed operative and nonoperative management.  I recommended operative management along with IV antibiotics.  We discussed laparoscopic appendectomy. We discussed the risk and benefits of surgery including but not limited to bleeding, infection, injury to surrounding structures, need to convert to an open procedure, blood clot formation, post operative abscess or wound infection, staple line complications such as leak or bleeding, hernia formation, post operative ileus, need for additional procedures, anesthesia complications, and the typical postoperative course. I explained that the patient should expect a good improvement in their symptoms.  Serita Grammes. Redmond Pulling, MD, FACS General, Bariatric, & Minimally Invasive Surgery [facility], [place]

## 2018-08-29 NOTE — Anesthesia Procedure Notes (Signed)
Procedure Name: Intubation Date/Time: 08/29/2018 9:37 AM Performed by: Cleda Daub, CRNA Pre-anesthesia Checklist: Patient identified, Emergency Drugs available, Suction available and Patient being monitored Patient Re-evaluated:Patient Re-evaluated prior to induction Oxygen Delivery Method: Circle system utilized Preoxygenation: Pre-oxygenation with 100% oxygen Induction Type: IV induction and Rapid sequence Laryngoscope Size: Mac and 3 Grade View: Grade I Tube type: Oral Tube size: 7.5 mm Number of attempts: 1 Airway Equipment and Method: Stylet Placement Confirmation: ETT inserted through vocal cords under direct vision,  positive ETCO2 and breath sounds checked- equal and bilateral Secured at: 22 cm Tube secured with: Tape Dental Injury: Teeth and Oropharynx as per pre-operative assessment

## 2018-08-29 NOTE — Op Note (Addendum)
Jared Copeland [id number] 01/28/1976 08/29/2018  Appendectomy, Lap, Procedure Note  Indications: The patient presented with a history of right-sided abdominal pain. A CT revealed findings consistent with acute appendicitis.  Pre-operative Diagnosis: Acute appendicitis without mention of peritonitis  Post-operative Diagnosis: Same  Surgeon: Gaynelle AduEric Moo Gravley MD FACS  Assistants: none  Anesthesia: General endotracheal anesthesia  Procedure Details  The patient was seen again in the Holding Room. The risks, benefits, complications, treatment options, and expected outcomes were discussed with the patient and/or family. The possibilities of perforation of viscus, bleeding, recurrent infection, the need for additional procedures, failure to diagnose a condition, and creating a complication requiring transfusion or operation were discussed. There was concurrence with the proposed plan and informed consent was obtained. The site of surgery was properly noted. The patient was taken to Operating Room, identified as Jared Copeland and the procedure verified as Appendectomy. A Time Out was held and the above information confirmed.  The patient was placed in the supine position and general anesthesia was induced, along with placement of orogastric tube, SCDs, and a Foley catheter. The abdomen was prepped and draped in a sterile fashion. A 1.5 centimeter infraumbilical incision was made.  The umbilical stalk was elevated, and the midline fascia was incised with a #11 blade.  A Kelly clamp was used to confirm entrance into the peritoneal cavity.  A pursestring suture was passed around the incision with a 0 Vicryl.  A 12mm Hasson was introduced into the abdomen and the tails of the suture were used to hold the Hasson in place.   The pneumoperitoneum was then established to steady pressure of 15 mmHg.  Additional 5 mm cannulas then placed in the left lower quadrant of the abdomen and the suprapubic  region under direct visualization. A careful evaluation of the entire abdomen was carried out. The patient was placed in Trendelenburg and left lateral decubitus position. The small intestines were retracted in the cephalad and left lateral direction away from the pelvis and right lower quadrant. The patient was found to have an inflammed appendix that was adhered to right pelvic inlet with some fibrinous exudate on the mesoappendix. There was trace thin purulent fluid in the pelvis. There was no evidence of perforation.  The appendix was carefully dissected. The appendix was was skeletonized with the harmonic scalpel.   The appendix was divided at its base using an endo-GIA stapler with a blue load.  However it was obvious that when the stapler had been applied and clamped down on the appendiceal base that it fractured the appendix away from the cecum.  There was an obvious opening into the appendiceal stump into the cecum.  I therefore decided to mobilize the cecum.  Some of the lateral attachments were taken down with harmonic scalpel.  I then took down the remaining part of the ileocecal fold.  I then took powered Echelon 60 mm laparoscopic stapler with a blue load and was able to staple across a small cuff of cecum while retracting on the appendiceal orifice.  This left no appendiceal stump.  The staple line was intact.  There was no stricture of the cecum.   The appendix and appendiceal stump was removed from the abdomen with an Ecco bag through the umbilical port.  There was no evidence of bleeding, leakage, or complication after division of the appendix. Irrigation was also performed and irrigate suctioned from the abdomen as well.  Local was infiltrated around the umbilicus and along the  right lateral abdominal wall using laparoscopic assistance.  The umbilical port site was closed with the purse string suture. The closure was viewed laparoscopically. There was no residual palpable fascial defect.  The  trocar site skin wounds were closed with 4-0 Monocryl. Dermabond was applied to the skin incisions.  Instrument, sponge, and needle counts were correct at the conclusion of the case.   Findings: The appendix was found to be inflamed. There were not signs of necrosis.  There was not perforation. There was not abscess formation.  Estimated Blood Loss:  Minimal         Drains: none         Specimens: appendix and appediceal stump          Complications:  None; patient tolerated the procedure well.         Disposition: PACU - hemodynamically stable.         Condition: stable  Discussed surgery and postop care with wife via phone [place] interpreter.   Leighton Ruff. Redmond Pulling, MD, FACS General, Bariatric, & Minimally Invasive Surgery [facility], [place]

## 2018-08-29 NOTE — ED Notes (Signed)
ED TO INPATIENT HANDOFF REPORT  ED Nurse Name and Phone #: [phone] Shawna OrleansMelanie, RN  S Name/Age/Gender Jared Copeland 43 y.o. male Room/Bed: 027C/027C  Code Status   Code Status: Full Code  Home/SNF/Other Home Patient oriented to: self, place, time and situation Is this baseline? Yes   Triage Complete: Triage complete  Chief Complaint LOWER ABD PAIN  Triage Note Pt c/o RLQ pain that started last night and has increased throughout the day. Denies nausea/vomiting/diarrhea/urinary symptoms.    Allergies No Known Allergies  Level of Care/Admitting Diagnosis ED Disposition    ED Disposition Condition Comment   Admit  [facility] [[id number]]  Level of Care: Med-Surg [16]  Covid Evaluation: Screening Protocol (No Symptoms)  Diagnosis: Acute appendicitis [147829][744919]  Admitting Physician: CCS, MD [[id number]]  Attending Physician: CCS, MD [[id number]]  Bed request comments: 6N  PT Class (Do Not Modify): Observation [104]  PT Acc Code (Do Not Modify): Observation [10022]       B Medical/Surgery History History reviewed. No pertinent past medical history. Past Surgical History:  Procedure Laterality Date  . BACK SURGERY       A IV Location/Drains/Wounds Patient Lines/Drains/Airways Status   Active Line/Drains/Airways    Name:   Placement date:   Placement time:   Site:   Days:   Peripheral IV 08/28/18 Right Antecubital   08/28/18    2329    Antecubital   1          Intake/Output Last 24 hours No intake or output data in the 24 hours ending 08/29/18 0220  Labs/Imaging Results for orders placed or performed during the hospital encounter of 08/28/18 (from the past 48 hour(s))  Lipase, blood     Status: None   Collection Time: 08/28/18 10:01 PM  Result Value Ref Range   Lipase 24 11 - 51 U/L    Comment: Performed at [facility], [address] [place], [place] [address]  Comprehensive metabolic panel     Status: Abnormal    Collection Time: 08/28/18 10:01 PM  Result Value Ref Range   Sodium 138 135 - 145 mmol/L   Potassium 3.7 3.5 - 5.1 mmol/L   Chloride 103 98 - 111 mmol/L   CO2 29 22 - 32 mmol/L   Glucose, Bld 126 (H) 70 - 99 mg/dL   BUN 14 6 - 20 mg/dL   Creatinine, Ser 0.860.97 0.61 - 1.24 mg/dL   Calcium 9.0 8.9 - 57.810.3 mg/dL   Total Protein 7.7 6.5 - 8.1 g/dL   Albumin 4.2 3.5 - 5.0 g/dL   AST 26 15 - 41 U/L   ALT 26 0 - 44 U/L   Alkaline Phosphatase 74 38 - 126 U/L   Total Bilirubin 0.6 0.3 - 1.2 mg/dL   GFR calc non Af Amer >60 >60 mL/min   GFR calc Af Amer >60 >60 mL/min   Anion gap 6 5 - 15    Comment: Performed at [facility], [address] [place], [place] [address]  CBC     Status: Abnormal   Collection Time: 08/28/18 10:01 PM  Result Value Ref Range   WBC 18.3 (H) 4.0 - 10.5 K/uL   RBC 4.66 4.22 - 5.81 MIL/uL   Hemoglobin 13.5 13.0 - 17.0 g/dL   HCT 95.241.1 84.139.0 - 32.452.0 %   MCV 88.2 80.0 - 100.0 fL   MCH 29.0 26.0 - 34.0 pg   MCHC 32.8 30.0 - 36.0  g/dL   RDW 78.212.6 95.611.5 - 21.315.5 %   Platelets 249 150 - 400 K/uL   nRBC 0.0 0.0 - 0.2 %    Comment: Performed at [facility], [address] [place], [place] [address]  Differential     Status: Abnormal   Collection Time: 08/28/18 10:01 PM  Result Value Ref Range   Neutrophils Relative % 86 %   Neutro Abs 15.9 (H) 1.7 - 7.7 K/uL   Lymphocytes Relative 8 %   Lymphs Abs 1.4 0.7 - 4.0 K/uL   Monocytes Relative 5 %   Monocytes Absolute 0.9 0.1 - 1.0 K/uL   Eosinophils Relative 0 %   Eosinophils Absolute 0.1 0.0 - 0.5 K/uL   Basophils Relative 0 %   Basophils Absolute 0.1 0.0 - 0.1 K/uL    Comment: Performed at [facility], [address] [place], [place] [address]  Urinalysis, Routine w reflex microscopic     Status: None   Collection Time: 08/28/18 10:20 PM  Result Value Ref Range   Color, Urine YELLOW YELLOW   APPearance CLEAR CLEAR   Specific Gravity, Urine 1.015 1.005 - 1.030   pH 8.0 5.0 - 8.0   Glucose, UA  NEGATIVE NEGATIVE mg/dL   Hgb urine dipstick NEGATIVE NEGATIVE   Bilirubin Urine NEGATIVE NEGATIVE   Ketones, ur NEGATIVE NEGATIVE mg/dL   Protein, ur NEGATIVE NEGATIVE mg/dL   Nitrite NEGATIVE NEGATIVE   Leukocytes,Ua NEGATIVE NEGATIVE    Comment: Performed at [facility], [address] [place], [place] [address]   Ct Abdomen Pelvis W Contrast  Result Date: 08/29/2018 CLINICAL DATA:  43 year old male with abdominal pain. Concern for acute appendicitis. EXAM: CT ABDOMEN AND PELVIS WITH CONTRAST TECHNIQUE: Multidetector CT imaging of the abdomen and pelvis was performed using the standard protocol following bolus administration of intravenous contrast. CONTRAST:  100mL OMNIPAQUE IOHEXOL 300 MG/ML  SOLN COMPARISON:  CT of the abdomen pelvis dated 07/02/2017 FINDINGS: Lower chest: The visualized lung bases are clear. No intra-abdominal free air or free fluid. Hepatobiliary: No focal liver abnormality is seen. No gallstones, gallbladder wall thickening, or biliary dilatation. Pancreas: Unremarkable. No pancreatic ductal dilatation or surrounding inflammatory changes. Spleen: Normal in size without focal abnormality. Adrenals/Urinary Tract: Adrenal glands are unremarkable. Kidneys are normal, without renal calculi, focal lesion, or hydronephrosis. Bladder is unremarkable. Stomach/Bowel: There is no bowel obstruction. The appendix is enlarged and inflamed measuring 12 mm in thickness. The appendix is located in the right lower quadrant inferior and medial to the cecum. There is enhancement of the appendiceal mucosa. No abscess or evidence of perforation. Vascular/Lymphatic: The abdominal aorta and IVC appear unremarkable. No portal venous gas. There is no adenopathy. Reproductive: The prostate and seminal vesicles are grossly unremarkable. No pelvic mass. Other: None Musculoskeletal: L5-S1 interbody and posterior fusion as well as anterior fusion similar to prior CT. No acute osseous pathology.  IMPRESSION: Acute appendicitis. No abscess or perforation. Electronically Signed   By: Elgie CollardArash  Radparvar M.D.   On: 08/29/2018 01:11    Pending Labs Unresulted Labs (From admission, onward)    Start     Ordered   08/29/18 0500  Basic metabolic panel  Tomorrow morning,   R     08/29/18 0157   08/29/18 0500  CBC  Tomorrow morning,   R     08/29/18 0157   08/29/18 0155  HIV antibody (Routine Testing)  Once,   R     08/29/18 0157  08/28/18 2246  SARS Coronavirus 2 (CEPHEID - Performed in [place] lab), Hosp Order  (Asymptomatic Patients Labs)  Once,   R    Question:  Rule Out  Answer:  Yes   08/28/18 2245          Vitals/Pain Today's Vitals   08/29/18 0000 08/29/18 0015 08/29/18 0050 08/29/18 0130  BP: 126/80 129/78  129/73  Pulse: 81 80  86  Resp:      Temp:      TempSrc:      SpO2: 97% 99%  96%  PainSc:   3      Isolation Precautions No active isolations  Medications Medications  sodium chloride flush (NS) 0.9 % injection 3 mL (3 mLs Intravenous Not Given 08/28/18 2222)  enoxaparin (LOVENOX) injection 40 mg (has no administration in time range)  cefTRIAXone (ROCEPHIN) 2 g in sodium chloride 0.9 % 100 mL IVPB (has no administration in time range)    And  metroNIDAZOLE (FLAGYL) IVPB 500 mg (has no administration in time range)  acetaminophen (TYLENOL) tablet 1,000 mg (has no administration in time range)  traMADol (ULTRAM) tablet 50 mg (has no administration in time range)  ibuprofen (ADVIL) tablet 600 mg (has no administration in time range)  HYDROmorphone (DILAUDID) injection 0.5 mg (has no administration in time range)  diphenhydrAMINE (BENADRYL) capsule 25 mg (has no administration in time range)    Or  diphenhydrAMINE (BENADRYL) injection 25 mg (has no administration in time range)  docusate sodium (COLACE) capsule 100 mg (has no administration in time range)  ondansetron (ZOFRAN-ODT) disintegrating tablet 4 mg (has no administration in time range)    Or   ondansetron (ZOFRAN) injection 4 mg (has no administration in time range)  metoprolol tartrate (LOPRESSOR) injection 5 mg (has no administration in time range)  hydrALAZINE (APRESOLINE) injection 10 mg (has no administration in time range)  morphine 4 MG/ML injection 4 mg (4 mg Intravenous Given 08/28/18 2335)  ondansetron (ZOFRAN) injection 4 mg (4 mg Intravenous Given 08/28/18 2335)  cefTRIAXone (ROCEPHIN) 2 g in sodium chloride 0.9 % 100 mL IVPB (0 g Intravenous Stopped 08/29/18 0050)    And  metroNIDAZOLE (FLAGYL) IVPB 500 mg (0 mg Intravenous Stopped 08/29/18 0200)  sodium chloride 0.9 % bolus 1,000 mL (0 mLs Intravenous Stopped 08/29/18 0050)  iohexol (OMNIPAQUE) 300 MG/ML solution 100 mL (100 mLs Intravenous Contrast Given 08/29/18 0023)    Mobility walks Low fall risk   Focused Assessments GI  Lung sounds:   O2 Device: Room Air        R Recommendations: See Admitting Provider Note  Report given to:   Additional Notes:

## 2018-08-30 ENCOUNTER — Encounter (HOSPITAL_COMMUNITY): Payer: Self-pay | Admitting: General Surgery

## 2018-08-30 LAB — HIV ANTIBODY (ROUTINE TESTING W REFLEX): HIV Screen 4th Generation wRfx: NONREACTIVE

## 2018-08-30 MED ORDER — HYDROCODONE-ACETAMINOPHEN 5-325 MG PO TABS: 1.0000 | ORAL_TABLET | Freq: Four times a day (QID) | ORAL | 0 refills | Status: DC | PRN

## 2018-08-30 NOTE — TOC Transition Note (Signed)
Transition of Care [facility]) - CM/SW Discharge Note   Patient Details  Name: Jared Copeland MRN: [id number] Date of Birth: December 09, 1975  [facility]) CM/SW Contact:  Pollie Friar, RN Phone Number: 08/30/2018, 9:19 AM   Clinical Narrative:    Pt discharging home with self care. Pt with only pain medication for d/c meds.  [place] information left on AVS in case pt stays in [place] for PCP follow up.  No further needs per TOC.   Final next level of care: Home/Self Care Barriers to Discharge: Insurance Authorization   Patient Goals and CMS Choice        Discharge Placement                       Discharge Plan and Services                                     Social Determinants of Health (SDOH) Interventions     Readmission Risk Interventions No flowsheet data found.

## 2018-08-30 NOTE — Discharge Instructions (Signed)
You may return to work without any physical restrictions in 3 weeks      [facility] LAPAROSCOPIC SURGERY: POST OP INSTRUCTIONS Always review your discharge instruction sheet given to you by the facility where your surgery was performed. IF YOU HAVE DISABILITY OR FAMILY LEAVE FORMS, YOU MUST BRING THEM TO THE OFFICE FOR PROCESSING.   DO NOT GIVE THEM TO YOUR DOCTOR.  1. A prescription for pain medication may be given to you upon discharge.  Take your pain medication as prescribed, if needed.  If narcotic pain medicine is not needed, then you may take acetaminophen (Tylenol) or ibuprofen (Advil) as needed. 2. Take your usually prescribed medications unless otherwise directed. 3. If you need a refill on your pain medication, please contact your pharmacy.  They will contact our office to request authorization. Prescriptions will not be filled after 5pm or on week-ends. 4. You should follow a light diet the first few days after arrival home, such as soup and crackers, etc.  Be sure to include lots of fluids daily. 5. Most patients will experience some swelling and bruising in the area of the incisions.  Ice packs will help.  Swelling and bruising can take several days to resolve.  6. It is common to experience some constipation if taking pain medication after surgery.  Increasing fluid intake and taking a stool softener (such as Colace) will usually help or prevent this problem from occurring.  A mild laxative (Milk of Magnesia or Miralax) should be taken according to package instructions if there are no bowel movements after 48 hours. 7. Unless discharge instructions indicate otherwise, you may remove your bandages 24-48 hours after surgery, and you may shower at that time.  You may have steri-strips (small skin tapes) in place directly over the incision.  These strips should be left on the skin for 7-10 days.  If your surgeon used skin glue on the incision, you may  shower in 24 hours.  The glue will flake off over the next 2-3 weeks.  Any sutures or staples will be removed at the office during your follow-up visit. 8. ACTIVITIES:  You may resume regular (light) daily activities beginning the next day--such as daily self-care, walking, climbing stairs--gradually increasing activities as tolerated.  You may have sexual intercourse when it is comfortable.  Refrain from any heavy lifting or straining until approved by your doctor. a. You may drive when you are no longer taking prescription pain medication, you can comfortably wear a seatbelt, and you can safely maneuver your car and apply brakes. b. RETURN TO WORK:  __________________________________________________________ 9. You should see your doctor in the office for a follow-up appointment approximately 2-3 weeks after your surgery.  Make sure that you call for this appointment within a day or two after you arrive home to insure a convenient appointment time. 10. OTHER INSTRUCTIONS: __________________________________________________________________________________________________________________________ __________________________________________________________________________________________________________________________ WHEN TO CALL YOUR DOCTOR: 1. Fever over 101.0 2. Inability to urinate 3. Continued bleeding from incision. 4. Increased pain, redness, or drainage from the incision. 5. Increasing abdominal pain  The clinic staff is available to answer your questions during regular business hours.  Please dont hesitate to call and ask to speak to one of the nurses for clinical concerns.  If you have a medical emergency, go to the nearest emergency room or call 911.  A surgeon from [facility] is always on call at the hospital. [address], [address], [place], [place]  [address] ?  [id number], [place], [place]   [id number] [phone] ? [phone] ? FAX [phone] Web site:  [email or web address]             Managing Your Pain After Surgery Without Opioids    Thank you for participating in our program to help patients manage their pain after surgery without opioids. This is part of our effort to provide you with the best care possible, without exposing you or your family to the risk that opioids pose.  What pain can I expect after surgery? You can expect to have some pain after surgery. This is normal. The pain is typically worse the day after surgery, and quickly begins to get better. Many studies have found that many patients are able to manage their pain after surgery with Over-the-Counter (OTC) medications such as Tylenol and Motrin. If you have a condition that does not allow you to take Tylenol or Motrin, notify your surgical team.  How will I manage my pain? The best strategy for controlling your pain after surgery is around the clock pain control with Tylenol (acetaminophen) and Motrin (ibuprofen or Advil). Alternating these medications with each other allows you to maximize your pain control. In addition to Tylenol and Motrin, you can use heating pads or ice packs on your incisions to help reduce your pain.  How will I alternate your regular strength over-the-counter pain medication? You will take a dose of pain medication every three hours. ; Start by taking 650 mg of Tylenol (2 pills of 325 mg) ; 3 hours later take 600 mg of Motrin (3 pills of 200 mg) ; 3 hours after taking the Motrin take 650 mg of Tylenol ; 3 hours after that take 600 mg of Motrin.   - 1 -  See example - if your first dose of Tylenol is at 12:00 PM   12:00 PM Tylenol 650 mg (2 pills of 325 mg)  3:00 PM Motrin 600 mg (3 pills of 200 mg)  6:00 PM Tylenol 650 mg (2 pills of 325 mg)  9:00 PM Motrin 600 mg (3 pills of 200 mg)  Continue alternating every 3 hours   We recommend that you follow this schedule around-the-clock for at least 3 days after  surgery, or until you feel that it is no longer needed. Use the table on the last page of this handout to keep track of the medications you are taking. Important: Do not take more than 3000mg  of Tylenol or 3200mg  of Motrin in a 24-hour period. Do not take ibuprofen/Motrin if you have a history of bleeding stomach ulcers, severe kidney disease, &/or actively taking a blood thinner  What if I still have pain? If you have pain that is not controlled with the over-the-counter pain medications (Tylenol and Motrin or Advil) you might have what we call breakthrough pain. You will receive a prescription for a small amount of an opioid pain medication such as Oxycodone, Tramadol, or Tylenol with Codeine. Use these opioid pills in the first 24 hours after surgery if you have breakthrough pain. Do not take more than 1 pill every 4-6 hours.  If you still have uncontrolled pain after using all opioid pills, don't hesitate to call our staff using the number provided. We will help make sure you are managing your pain in the best way possible, and if necessary, we can provide a prescription for additional pain medication.   Day 1    Time  Name of Medication Number of pills  taken  Amount of Acetaminophen  Pain Level   Comments  AM PM       AM PM       AM PM       AM PM       AM PM       AM PM       AM PM       AM PM       Total Daily amount of Acetaminophen Do not take more than  3,000 mg per day      Day 2    Time  Name of Medication Number of pills taken  Amount of Acetaminophen  Pain Level   Comments  AM PM       AM PM       AM PM       AM PM       AM PM       AM PM       AM PM       AM PM       Total Daily amount of Acetaminophen Do not take more than  3,000 mg per day      Day 3    Time  Name of Medication Number of pills taken  Amount of Acetaminophen  Pain Level   Comments  AM PM       AM PM       AM PM       AM PM          AM PM       AM PM       AM PM        AM PM       Total Daily amount of Acetaminophen Do not take more than  3,000 mg per day      Day 4    Time  Name of Medication Number of pills taken  Amount of Acetaminophen  Pain Level   Comments  AM PM       AM PM       AM PM       AM PM       AM PM       AM PM       AM PM       AM PM       Total Daily amount of Acetaminophen Do not take more than  3,000 mg per day      Day 5    Time  Name of Medication Number of pills taken  Amount of Acetaminophen  Pain Level   Comments  AM PM       AM PM       AM PM       AM PM       AM PM       AM PM       AM PM       AM PM       Total Daily amount of Acetaminophen Do not take more than  3,000 mg per day       Day 6    Time  Name of Medication Number of pills taken  Amount of Acetaminophen  Pain Level  Comments  AM PM       AM PM       AM PM       AM PM       AM PM  AM PM       AM PM       AM PM       Total Daily amount of Acetaminophen Do not take more than  3,000 mg per day      Day 7    Time  Name of Medication Number of pills taken  Amount of Acetaminophen  Pain Level   Comments  AM PM       AM PM       AM PM       AM PM       AM PM       AM PM       AM PM       AM PM       Total Daily amount of Acetaminophen Do not take more than  3,000 mg per day        For additional information about how and where to safely dispose of unused opioid medications - [employer]  Disclaimer: This document contains information and/or instructional materials adapted from [place] for the typical patient with your condition. It does not replace medical advice from your health care provider because your experience may differ from that of the typical patient. Talk to your health care provider if you have any questions about this document, your condition or your treatment plan. Adapted from [place]

## 2018-08-30 NOTE — Progress Notes (Signed)
Went over discharge instructions utilizing the video interpreter with no concerns voiced. Pt left unit in stable condition

## 2018-08-30 NOTE — Discharge Summary (Signed)
  Patient ID: Jared Copeland [id number] 43 y.o. [birth year]  Admit date: 08/28/2018  Discharge date and time: 08/30/2018  Admitting Physician: Vikki Ports Connor,MD  Discharge Physician: Adin Hector  Admission Diagnoses: LOWER ABD PAIN  Discharge Diagnoses: Acute appendicitis  Operations: Procedure(s): APPENDECTOMY LAPAROSCOPIC  Admission Condition: fair  Discharged Condition: good  Indication for Admission: This is a 43 year old Hispanic gentleman who presented to the emergency department with 24-hour history of right lower quadrant pain which was progressive in intensity he denied nausea vomiting or diarrhea but was constipated.  He has had prior abdominal surgery for spine surgery access.  On exam he was in mild distress.  He had significant localized tenderness in the right lower quadrant with guarding.  Less tender elsewhere.  Well-healed oblique left lower quadrant scar.  WBC was 18,000.  CT scan confirmed acute appendicitis but no abscess or perforation  Hospital Course: Patient was evaluated and admitted.  IV resuscitation and IV antibiotics were administered.  He was taken to the operating room by Dr. Greer Pickerel and underwent laparoscopic appendectomy.  He had uncomplicated acute appendicitis.  He was observed overnight and did well.  He became ambulatory.  Voiding without difficulty.  Was advancing his diet when seen in the morning.  Wanted to go home.  His abdomen was soft.  Minimal tenderness.  All of the wounds look good.    He was given instructions in diet and activities.  He was given instructions in how to use Tylenol and ibuprofen for pain.  A prescription for Norco was sent to his pharmacy just in case he needed it.  Diet and activities were discussed.  Return to work in 3 weeks.  Follow-up with [place] in the [place] clinic in 3 weeks.  Consults: None  Significant Diagnostic Studies: Lab.  CT scan.  Surgical pathology.  Treatments: surgery: Laparoscopic  appendectomy  Disposition: Home  Patient Instructions:  Allergies as of 08/30/2018   No Known Allergies     Medication List    STOP taking these medications   menthol-cetylpyridinium 3 MG lozenge Commonly known as:  CEPACOL   omeprazole 20 MG capsule Commonly known as:  PRILOSEC     TAKE these medications   HYDROcodone-acetaminophen 5-325 MG tablet Commonly known as:  Norco Take 1-2 tablets by mouth every 6 (six) hours as needed for moderate pain or severe pain.       Activity: no heavy lifting for 3 weeks Diet: regular diet Wound Care: as directed  Follow-up:  With [facility] clinic in 3 weeks   Addendum: I logged onto the PMP aware website and reviewed his prescription medication history.  Signed: Edsel Petrin. Dalbert Batman, M.D., FACS General and minimally invasive surgery Breast and Colorectal Surgery  08/30/2018, 8:57 AM

## 2018-09-08 NOTE — Progress Notes (Signed)
Patient ID: Jared Copeland, male   DOB: 05/21/1975, 43 y.o.   MRN: [id number]    Virtual Visit via Telephone Note  I connected with Jamal Maes on 09/09/18 at  1:50 PM EDT by telephone and verified that I am speaking with the correct person using two identifiers.   I discussed the limitations, risks, security and privacy concerns of performing an evaluation and management service by telephone and the availability of in person appointments. I also discussed with the patient that there may be a patient responsible charge related to this service. The patient expressed understanding and agreed to proceed.  Patient location: home My Location:  [place] office Persons on the call:  Myself, the patient, and [place]  History of Present Illness: After hospitalization 6/5-08/30/2018 for appendicitis and appendectomy. Pain is much improved.  Appetite is normal.  No fever.  He is doing great.  Only occasional pain with quick movements.  No fever.  Bowels moving.  No N/V.  Has f/up with surgery tomorrow  From discharge summary:  Indication for Admission: This is a 43 year old Hispanic gentleman who presented to the emergency department with 24-hour history of right lower quadrant pain which was progressive in intensity he denied nausea vomiting or diarrhea but was constipated.  He has had prior abdominal surgery for spine surgery access.  On exam he was in mild distress.  He had significant localized tenderness in the right lower quadrant with guarding.  Less tender elsewhere.  Well-healed oblique left lower quadrant scar.  WBC was 18,000.  CT scan confirmed acute appendicitis but no abscess or perforation  Hospital Course: Patient was evaluated and admitted.  IV resuscitation and IV antibiotics were administered.  He was taken to the operating room by Dr. Greer Pickerel and underwent laparoscopic appendectomy.  He had uncomplicated acute appendicitis.  He was observed overnight and  did well.  He became ambulatory.  Voiding without difficulty.  Was advancing his diet when seen in the morning.  Wanted to go home.  His abdomen was soft.  Minimal tenderness.  All of the wounds look good.    He was given instructions in diet and activities.  He was given instructions in how to use Tylenol and ibuprofen for pain.  A prescription for Norco was sent to his pharmacy just in case he needed it.  Diet and activities were discussed.  Return to work in 3 weeks.  Follow-up with [place] in the [facility] clinic in 3 weeks.    Observations/Objective:  A&Ox3   Assessment and Plan: 1. Acute appendicitis, unspecified acute appendicitis type Resolved s/p appendectomy.  Doing well  2. Language barrier pacific interpreters used and additional time performing visit was required.  3. Hospital discharge follow-up Much improvement    Follow Up Instructions: Keep f/up appt with surgery tomorrow.  F/up here prn.   I discussed the assessment and treatment plan with the patient. The patient was provided an opportunity to ask questions and all were answered. The patient agreed with the plan and demonstrated an understanding of the instructions.   The patient was advised to call back or seek an in-person evaluation if the symptoms worsen or if the condition fails to improve as anticipated.  I provided 11 minutes of non-face-to-face time during this encounter.   Freeman Caldron, PA-C

## 2018-09-09 ENCOUNTER — Other Ambulatory Visit: Payer: Self-pay

## 2018-09-09 ENCOUNTER — Ambulatory Visit: Payer: Self-pay | Attending: Internal Medicine | Admitting: Physician Assistant

## 2018-09-09 DIAGNOSIS — Z09 Encounter for follow-up examination after completed treatment for conditions other than malignant neoplasm: Secondary | ICD-10-CM

## 2018-09-09 DIAGNOSIS — K3580 Unspecified acute appendicitis: Secondary | ICD-10-CM

## 2018-09-09 DIAGNOSIS — Z789 Other specified health status: Secondary | ICD-10-CM

## 2021-02-14 IMAGING — CT CT ABDOMEN AND PELVIS WITH CONTRAST
2 of 5 series · 16 of 46 positions shown, 18 images · IV contrast (omnipaque)
Comparison: CT of the abdomen pelvis dated 07/02/2017

CLINICAL DATA: 42-year-old male with abdominal pain. Concern for
acute appendicitis.

EXAM:
CT ABDOMEN AND PELVIS WITH CONTRAST
TECHNIQUE: Multidetector CT imaging of the abdomen and pelvis was performed
using the standard protocol following bolus administration of
intravenous contrast.
CONTRAST:  100mL OMNIPAQUE IOHEXOL 300 MG/ML  SOLN

[Series 3: abdomen 5.0 · axial · 0.87mm/px · z∈[-791,-371]mm · 13 of 98 slices shown, 15 images]
[im 7/98  soft-tissue]
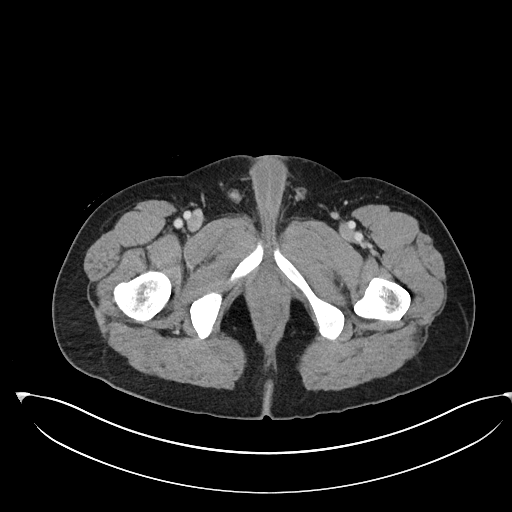
[im 7/98  bone]
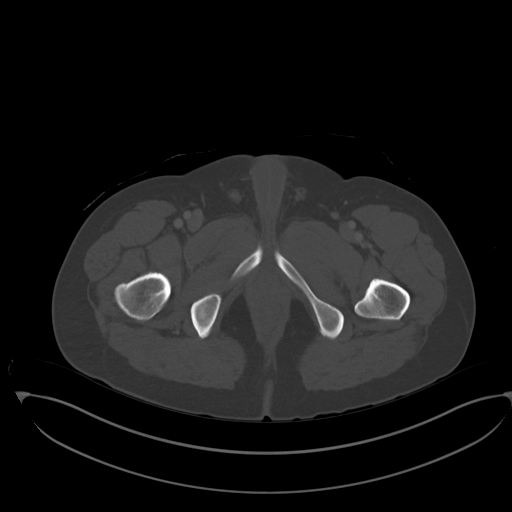
[im 13/98  soft-tissue]
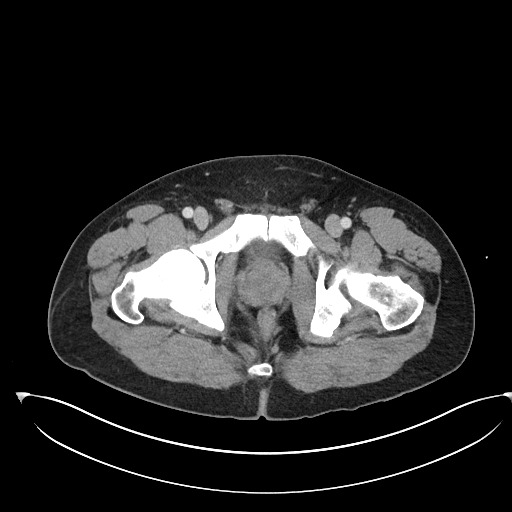
[im 20/98  soft-tissue]
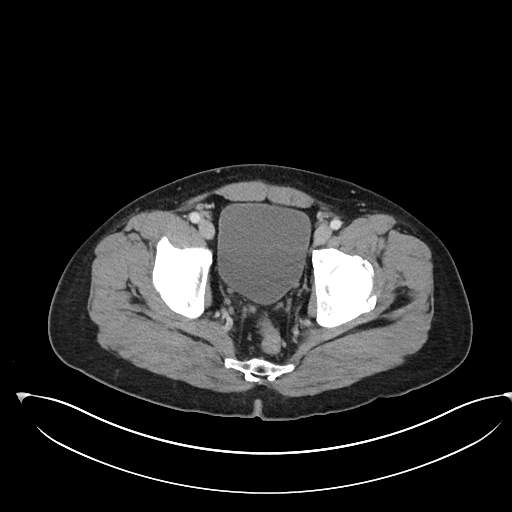
[im 26/98  soft-tissue]
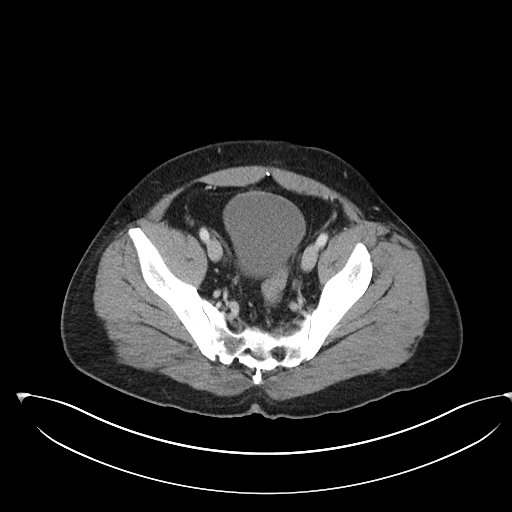
[im 33/98  soft-tissue]
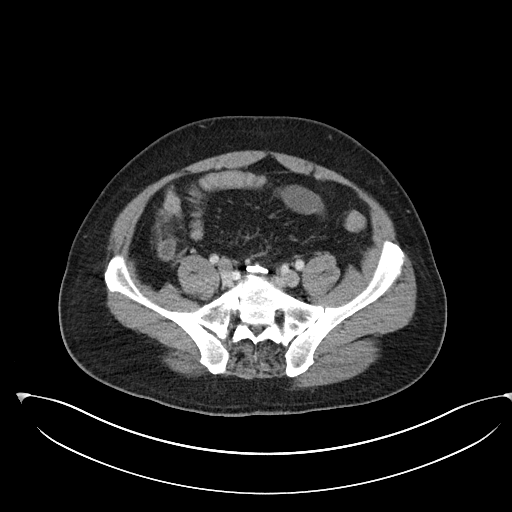
[im 39/98  soft-tissue]
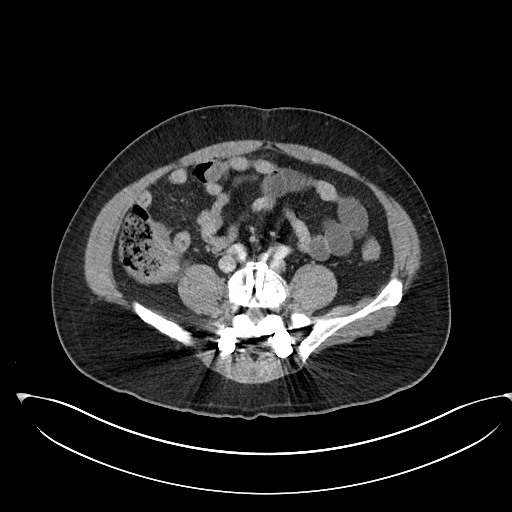
[im 52/98  soft-tissue]
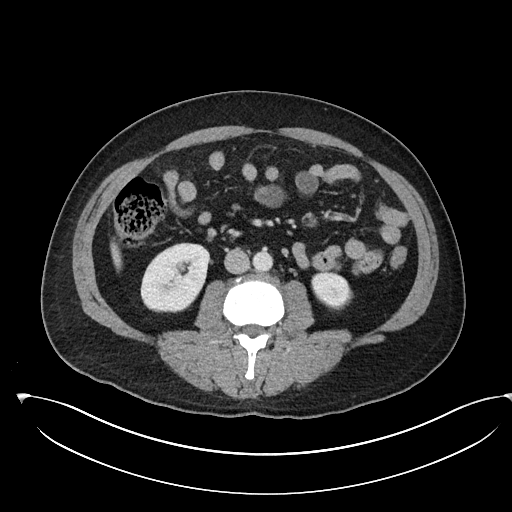
[im 59/98  soft-tissue]
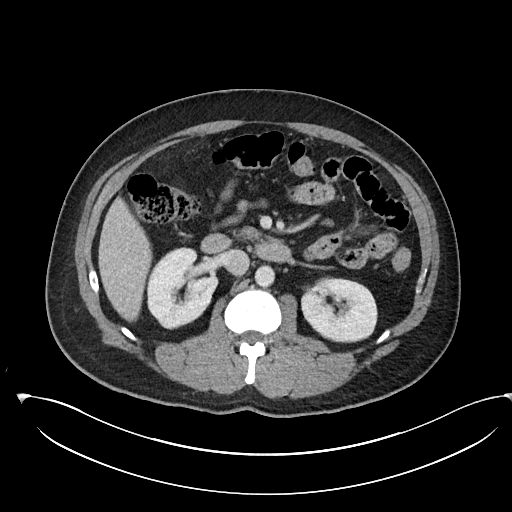
[im 65/98  soft-tissue]
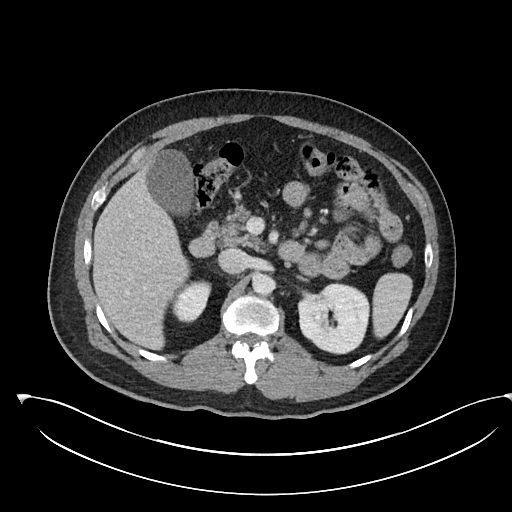
[im 65/98  bone]
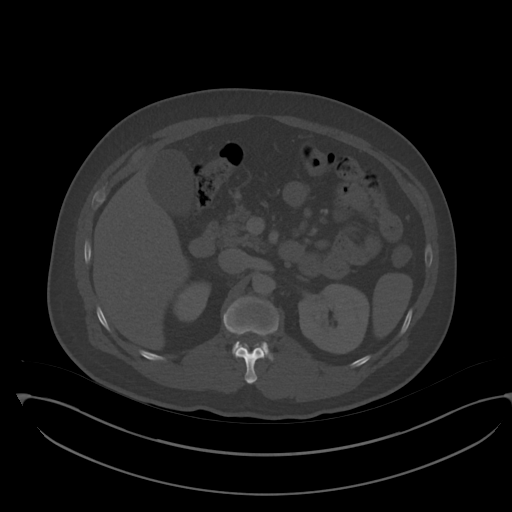
[im 72/98  soft-tissue]
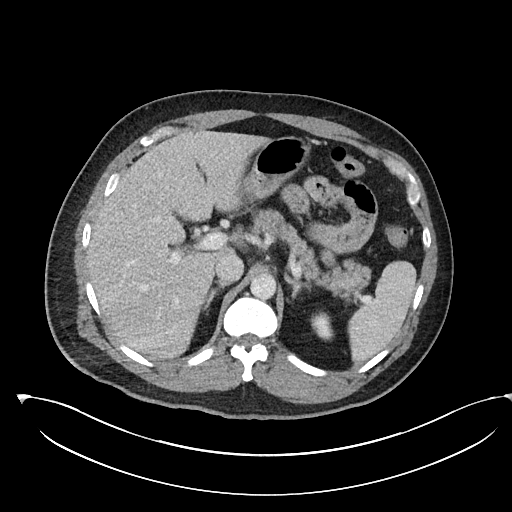
[im 78/98  soft-tissue]
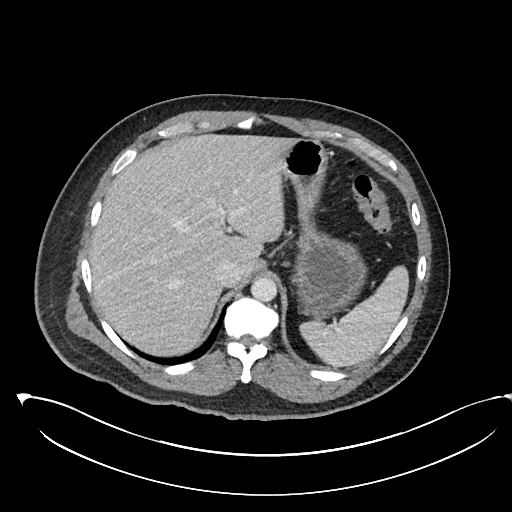
[im 85/98  soft-tissue]
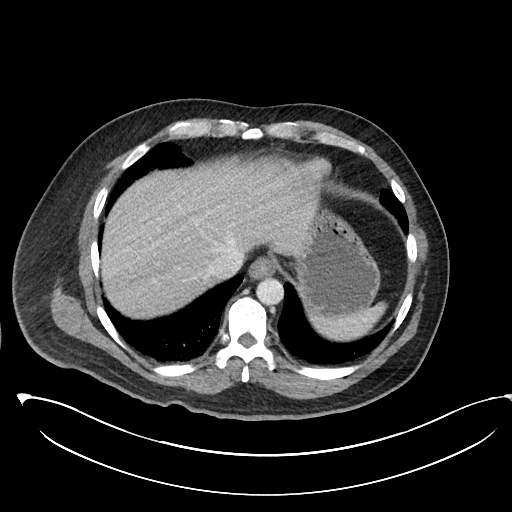
[im 91/98  soft-tissue]
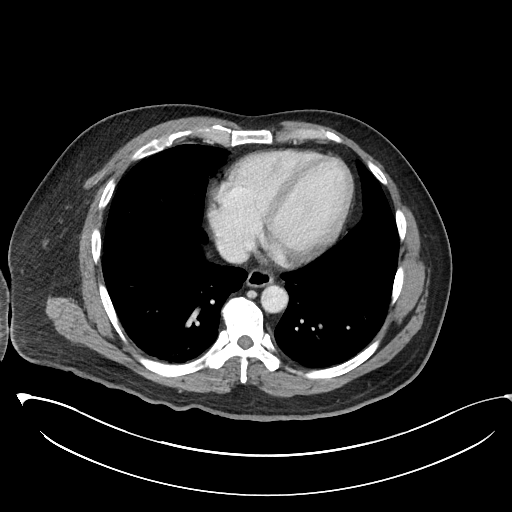

[Series 6: abdomen 3.0 mpr cor · coronal · 0.85mm/px · 3 of 101 slices shown]
[im 34/101  soft-tissue]
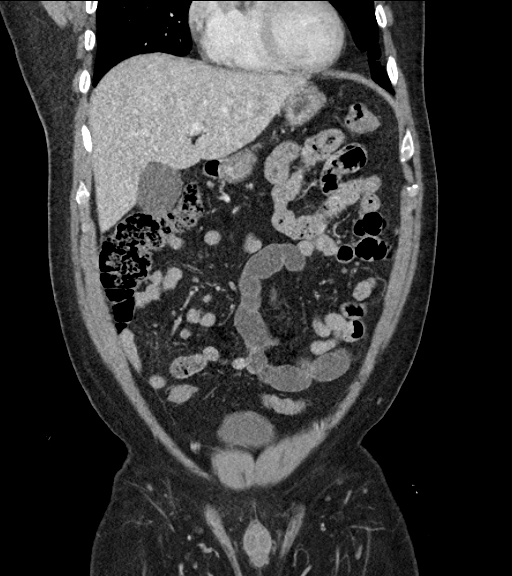
[im 45/101  soft-tissue]
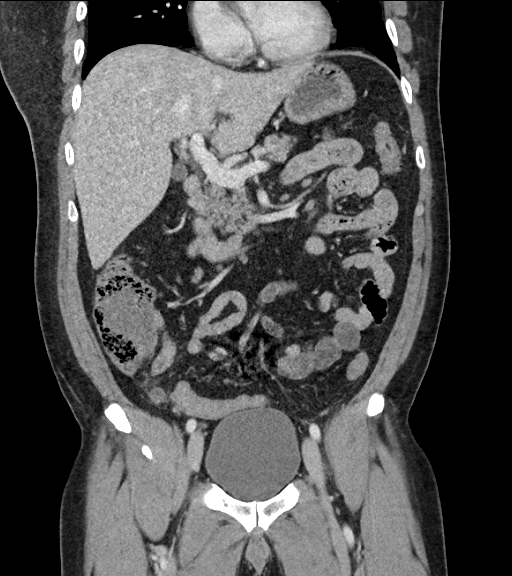
[im 56/101  soft-tissue]
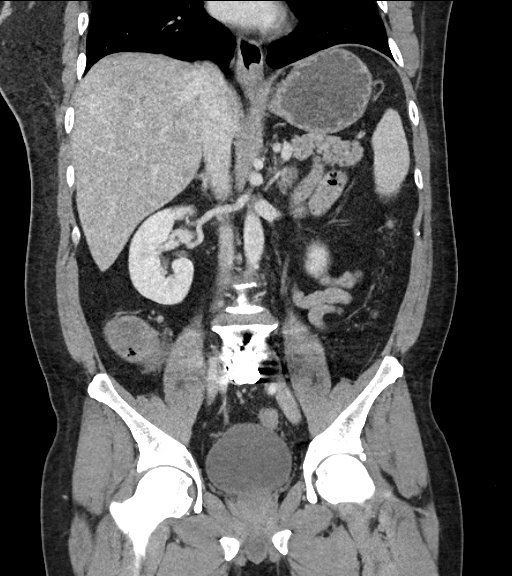

[16 of 46 positions shown; findings below may reference images not displayed]

FINDINGS: Lower chest: The visualized lung bases are clear.

No intra-abdominal free air or free fluid.

Hepatobiliary: No focal liver abnormality is seen. No gallstones,
gallbladder wall thickening, or biliary dilatation.

Pancreas: Unremarkable. No pancreatic ductal dilatation or
surrounding inflammatory changes.

Spleen: Normal in size without focal abnormality.

Adrenals/Urinary Tract: Adrenal glands are unremarkable. Kidneys are
normal, without renal calculi, focal lesion, or hydronephrosis.
Bladder is unremarkable.

Stomach/Bowel: There is no bowel obstruction. The appendix is
enlarged and inflamed measuring 12 mm in thickness. The appendix is
located in the right lower quadrant inferior and medial to the
cecum. There is enhancement of the appendiceal mucosa. No abscess or
evidence of perforation.

Vascular/Lymphatic: The abdominal aorta and IVC appear unremarkable.
No portal venous gas. There is no adenopathy.

Reproductive: The prostate and seminal vesicles are grossly
unremarkable. No pelvic mass.

Other: None

Musculoskeletal: L5-S1 interbody and posterior fusion as well as
anterior fusion similar to prior CT. No acute osseous pathology.
IMPRESSION: Acute appendicitis. No abscess or perforation.

## 2021-12-26 DIAGNOSIS — Z791 Long term (current) use of non-steroidal anti-inflammatories (NSAID): Secondary | ICD-10-CM | POA: Diagnosis not present

## 2021-12-26 DIAGNOSIS — R030 Elevated blood-pressure reading, without diagnosis of hypertension: Secondary | ICD-10-CM | POA: Diagnosis not present

## 2022-08-12 DIAGNOSIS — Z811 Family history of alcohol abuse and dependence: Secondary | ICD-10-CM | POA: Diagnosis not present

## 2022-08-12 DIAGNOSIS — Z791 Long term (current) use of non-steroidal anti-inflammatories (NSAID): Secondary | ICD-10-CM | POA: Diagnosis not present

## 2022-08-12 DIAGNOSIS — Z87891 Personal history of nicotine dependence: Secondary | ICD-10-CM | POA: Diagnosis not present

## 2022-08-12 DIAGNOSIS — Z6832 Body mass index (BMI) 32.0-32.9, adult: Secondary | ICD-10-CM | POA: Diagnosis not present

## 2022-08-12 DIAGNOSIS — E669 Obesity, unspecified: Secondary | ICD-10-CM | POA: Diagnosis not present
